# Patient Record
Sex: Male | Born: 1950 | Race: White | Hispanic: No | Marital: Married | Smoking: Never smoker
Health system: Southern US, Community
[De-identification: ages and names within clinical notes are randomized; demographics above are authoritative.]

## PROBLEM LIST (undated history)

## (undated) DIAGNOSIS — I10 Essential (primary) hypertension: Secondary | ICD-10-CM

## (undated) DIAGNOSIS — G4730 Sleep apnea, unspecified: Secondary | ICD-10-CM

## (undated) DIAGNOSIS — Z955 Presence of coronary angioplasty implant and graft: Secondary | ICD-10-CM

## (undated) DIAGNOSIS — M5416 Radiculopathy, lumbar region: Secondary | ICD-10-CM

## (undated) DIAGNOSIS — E119 Type 2 diabetes mellitus without complications: Secondary | ICD-10-CM

## (undated) DIAGNOSIS — I639 Cerebral infarction, unspecified: Secondary | ICD-10-CM

## (undated) DIAGNOSIS — J449 Chronic obstructive pulmonary disease, unspecified: Secondary | ICD-10-CM

## (undated) DIAGNOSIS — K219 Gastro-esophageal reflux disease without esophagitis: Secondary | ICD-10-CM

## (undated) HISTORY — PX: HX HIP REPLACEMENT: SHX124

## (undated) HISTORY — PX: HX BACK SURGERY: SHX140

## (undated) HISTORY — PX: CORONARY ARTERY ANGIOPLASTY: PR CATH30428

## (undated) HISTORY — DX: Radiculopathy, lumbar region: M54.16

## (undated) HISTORY — PX: SPINAL FUSION: SHX223

## (undated) HISTORY — PX: CORONARY ANGIOPLASTY WITH STENT PLACEMENT: SHX49

## (undated) HISTORY — PX: HERNIA REPAIR: SHX51

## (undated) HISTORY — DX: Type 2 diabetes mellitus without complications: E11.9

---

## 2002-04-03 ENCOUNTER — Ambulatory Visit (HOSPITAL_COMMUNITY): Payer: Self-pay | Admitting: INTERNAL MEDICINE

## 2002-04-19 ENCOUNTER — Ambulatory Visit (INDEPENDENT_AMBULATORY_CARE_PROVIDER_SITE_OTHER): Payer: Self-pay

## 2003-09-01 DIAGNOSIS — Z955 Presence of coronary angioplasty implant and graft: Secondary | ICD-10-CM

## 2003-09-01 HISTORY — DX: Presence of coronary angioplasty implant and graft: Z95.5

## 2015-01-25 DIAGNOSIS — N400 Enlarged prostate without lower urinary tract symptoms: Secondary | ICD-10-CM | POA: Diagnosis not present

## 2015-01-25 DIAGNOSIS — M549 Dorsalgia, unspecified: Secondary | ICD-10-CM | POA: Diagnosis not present

## 2015-01-25 DIAGNOSIS — J029 Acute pharyngitis, unspecified: Secondary | ICD-10-CM | POA: Diagnosis not present

## 2015-01-25 DIAGNOSIS — N19 Unspecified kidney failure: Secondary | ICD-10-CM | POA: Diagnosis not present

## 2015-03-26 DIAGNOSIS — E869 Volume depletion, unspecified: Secondary | ICD-10-CM | POA: Diagnosis not present

## 2015-03-26 DIAGNOSIS — R110 Nausea: Secondary | ICD-10-CM | POA: Diagnosis not present

## 2015-03-26 DIAGNOSIS — N179 Acute kidney failure, unspecified: Secondary | ICD-10-CM | POA: Diagnosis not present

## 2015-03-26 DIAGNOSIS — E785 Hyperlipidemia, unspecified: Secondary | ICD-10-CM | POA: Diagnosis not present

## 2015-03-26 DIAGNOSIS — R079 Chest pain, unspecified: Secondary | ICD-10-CM | POA: Diagnosis not present

## 2015-03-26 DIAGNOSIS — I959 Hypotension, unspecified: Secondary | ICD-10-CM | POA: Diagnosis not present

## 2015-03-26 DIAGNOSIS — G4733 Obstructive sleep apnea (adult) (pediatric): Secondary | ICD-10-CM | POA: Diagnosis not present

## 2015-03-26 DIAGNOSIS — R42 Dizziness and giddiness: Secondary | ICD-10-CM | POA: Diagnosis not present

## 2015-03-26 DIAGNOSIS — I10 Essential (primary) hypertension: Secondary | ICD-10-CM | POA: Diagnosis not present

## 2015-03-27 DIAGNOSIS — N179 Acute kidney failure, unspecified: Secondary | ICD-10-CM | POA: Diagnosis not present

## 2015-03-27 DIAGNOSIS — I10 Essential (primary) hypertension: Secondary | ICD-10-CM | POA: Diagnosis present

## 2015-03-27 DIAGNOSIS — E119 Type 2 diabetes mellitus without complications: Secondary | ICD-10-CM | POA: Diagnosis present

## 2015-03-27 DIAGNOSIS — E860 Dehydration: Secondary | ICD-10-CM | POA: Diagnosis present

## 2015-03-27 DIAGNOSIS — Z8249 Family history of ischemic heart disease and other diseases of the circulatory system: Secondary | ICD-10-CM | POA: Diagnosis not present

## 2015-03-27 DIAGNOSIS — I959 Hypotension, unspecified: Secondary | ICD-10-CM | POA: Diagnosis present

## 2015-03-27 DIAGNOSIS — I2510 Atherosclerotic heart disease of native coronary artery without angina pectoris: Secondary | ICD-10-CM | POA: Diagnosis present

## 2015-03-27 DIAGNOSIS — F17290 Nicotine dependence, other tobacco product, uncomplicated: Secondary | ICD-10-CM | POA: Diagnosis present

## 2015-03-27 DIAGNOSIS — G4733 Obstructive sleep apnea (adult) (pediatric): Secondary | ICD-10-CM | POA: Diagnosis present

## 2015-03-27 DIAGNOSIS — M549 Dorsalgia, unspecified: Secondary | ICD-10-CM | POA: Diagnosis present

## 2015-03-27 DIAGNOSIS — Z882 Allergy status to sulfonamides status: Secondary | ICD-10-CM | POA: Diagnosis not present

## 2015-03-27 DIAGNOSIS — E785 Hyperlipidemia, unspecified: Secondary | ICD-10-CM | POA: Diagnosis present

## 2015-03-27 DIAGNOSIS — E869 Volume depletion, unspecified: Secondary | ICD-10-CM | POA: Diagnosis present

## 2015-04-01 DIAGNOSIS — N19 Unspecified kidney failure: Secondary | ICD-10-CM | POA: Diagnosis not present

## 2015-04-01 DIAGNOSIS — N400 Enlarged prostate without lower urinary tract symptoms: Secondary | ICD-10-CM | POA: Diagnosis not present

## 2015-04-01 DIAGNOSIS — M549 Dorsalgia, unspecified: Secondary | ICD-10-CM | POA: Diagnosis not present

## 2015-04-01 DIAGNOSIS — J029 Acute pharyngitis, unspecified: Secondary | ICD-10-CM | POA: Diagnosis not present

## 2015-04-08 DIAGNOSIS — J029 Acute pharyngitis, unspecified: Secondary | ICD-10-CM | POA: Diagnosis not present

## 2015-04-08 DIAGNOSIS — N19 Unspecified kidney failure: Secondary | ICD-10-CM | POA: Diagnosis not present

## 2015-04-08 DIAGNOSIS — N400 Enlarged prostate without lower urinary tract symptoms: Secondary | ICD-10-CM | POA: Diagnosis not present

## 2015-04-08 DIAGNOSIS — M549 Dorsalgia, unspecified: Secondary | ICD-10-CM | POA: Diagnosis not present

## 2015-04-22 ENCOUNTER — Other Ambulatory Visit (HOSPITAL_COMMUNITY): Payer: Self-pay | Admitting: Internal Medicine

## 2015-04-22 ENCOUNTER — Encounter (HOSPITAL_BASED_OUTPATIENT_CLINIC_OR_DEPARTMENT_OTHER): Payer: Self-pay | Admitting: Emergency Medicine

## 2015-04-22 ENCOUNTER — Emergency Department (HOSPITAL_BASED_OUTPATIENT_CLINIC_OR_DEPARTMENT_OTHER)
Admission: EM | Admit: 2015-04-22 | Discharge: 2015-04-22 | Disposition: A | Discharge status: Home / Self Care | Payer: No Typology Code available for payment source | Attending: Emergency Medicine | Admitting: Emergency Medicine

## 2015-04-22 DIAGNOSIS — N3945 Continuous leakage: Secondary | ICD-10-CM

## 2015-04-22 DIAGNOSIS — E119 Type 2 diabetes mellitus without complications: Secondary | ICD-10-CM | POA: Diagnosis not present

## 2015-04-22 DIAGNOSIS — Z8669 Personal history of other diseases of the nervous system and sense organs: Secondary | ICD-10-CM | POA: Diagnosis not present

## 2015-04-22 DIAGNOSIS — R51 Headache: Secondary | ICD-10-CM | POA: Insufficient documentation

## 2015-04-22 DIAGNOSIS — N410 Acute prostatitis: Secondary | ICD-10-CM

## 2015-04-22 DIAGNOSIS — Z7982 Long term (current) use of aspirin: Secondary | ICD-10-CM | POA: Diagnosis not present

## 2015-04-22 DIAGNOSIS — R42 Dizziness and giddiness: Secondary | ICD-10-CM | POA: Insufficient documentation

## 2015-04-22 DIAGNOSIS — Z79899 Other long term (current) drug therapy: Secondary | ICD-10-CM | POA: Insufficient documentation

## 2015-04-22 DIAGNOSIS — R05 Cough: Secondary | ICD-10-CM | POA: Diagnosis not present

## 2015-04-22 DIAGNOSIS — R110 Nausea: Secondary | ICD-10-CM | POA: Diagnosis present

## 2015-04-22 HISTORY — DX: Presence of coronary angioplasty implant and graft: Z95.5

## 2015-04-22 HISTORY — DX: Sleep apnea, unspecified: G47.30

## 2015-04-22 LAB — CBC WITH DIFFERENTIAL/PLATELET
BASOS ABS: 0 10*3/uL (ref 0.0–0.1)
Basophils Relative: 0 % (ref 0–1)
Eosinophils Absolute: 0.1 10*3/uL (ref 0.0–0.7)
Eosinophils Relative: 1 % (ref 0–5)
HEMATOCRIT: 40.1 % (ref 39.0–52.0)
Hemoglobin: 13.5 g/dL (ref 13.0–17.0)
LYMPHS ABS: 1 10*3/uL (ref 0.7–4.0)
LYMPHS PCT: 11 % — AB (ref 12–46)
MCH: 30.3 pg (ref 26.0–34.0)
MCHC: 33.7 g/dL (ref 30.0–36.0)
MCV: 90.1 fL (ref 78.0–100.0)
MONO ABS: 0.9 10*3/uL (ref 0.1–1.0)
MONOS PCT: 11 % (ref 3–12)
NEUTROS ABS: 6.7 10*3/uL (ref 1.7–7.7)
Neutrophils Relative %: 77 % (ref 43–77)
Platelets: 124 10*3/uL — ABNORMAL LOW (ref 150–400)
RBC: 4.45 MIL/uL (ref 4.22–5.81)
RDW: 14 % (ref 11.5–15.5)
WBC: 8.7 10*3/uL (ref 4.0–10.5)

## 2015-04-22 LAB — COMPREHENSIVE METABOLIC PANEL
ALBUMIN: 3.6 g/dL (ref 3.5–5.0)
ALT: 20 U/L (ref 17–63)
ANION GAP: 9 (ref 5–15)
AST: 22 U/L (ref 15–41)
Alkaline Phosphatase: 62 U/L (ref 38–126)
BUN: 13 mg/dL (ref 6–20)
CHLORIDE: 100 mmol/L — AB (ref 101–111)
CO2: 26 mmol/L (ref 22–32)
Calcium: 9 mg/dL (ref 8.9–10.3)
Creatinine, Ser: 1.08 mg/dL (ref 0.61–1.24)
GFR calc Af Amer: >60 mL/min (ref >60)
GFR calc non Af Amer: >60 mL/min (ref >60)
GLUCOSE: 173 mg/dL — AB (ref 65–99)
POTASSIUM: 4.4 mmol/L (ref 3.5–5.1)
SODIUM: 135 mmol/L (ref 135–145)
TOTAL PROTEIN: 7.2 g/dL (ref 6.5–8.1)
Total Bilirubin: 1 mg/dL (ref 0.3–1.2)

## 2015-04-22 LAB — URINALYSIS, ROUTINE W REFLEX MICROSCOPIC
BILIRUBIN URINE: NEGATIVE
Glucose, UA: >1000 mg/dL — AB
Hgb urine dipstick: NEGATIVE
Ketones, ur: NEGATIVE mg/dL
NITRITE: NEGATIVE
PH: 7.5 (ref 5.0–8.0)
Protein, ur: 30 mg/dL — AB
SPECIFIC GRAVITY, URINE: 1.022 (ref 1.005–1.030)
Urobilinogen, UA: 1 mg/dL (ref 0.0–1.0)

## 2015-04-22 LAB — URINE MICROSCOPIC-ADD ON

## 2015-04-22 MED ORDER — SODIUM CHLORIDE 0.9 % IV BOLUS (SEPSIS)
1000.0000 mL | Freq: Once | INTRAVENOUS | Status: AC
  Administered 2015-04-22: 1000 mL via INTRAVENOUS

## 2015-04-22 MED ORDER — ONDANSETRON HCL 4 MG/2ML IJ SOLN
4.0000 mg | Freq: Once | INTRAMUSCULAR | Status: AC
  Administered 2015-04-22: 4 mg via INTRAVENOUS
  Filled 2015-04-22: qty 2

## 2015-04-22 MED ORDER — CIPROFLOXACIN HCL 500 MG PO TABS: 500.0000 mg | ORAL_TABLET | Freq: Two times a day (BID) | ORAL | Status: DC

## 2015-04-22 NOTE — ED Provider Notes (Signed)
CSN: [id number]     Arrival date & time 04/22/15  1943 History  This chart was scribed for Joseph Belling, MD by Helane Gunther, ED Scribe. This patient was seen in room MH07/MH07 and the patient's care was started at 8:00 PM.    Chief Complaint  Patient presents with  . Nausea   The history is provided by the patient. No language interpreter was used.   HPI Comments: Joseph Zuniga is a 64 y.o. male who presents to the Emergency Department complaining of nausea onset 3 days ago. He reports associated fever, a "pounding" frontal HA, dizziness, orange-tinged urine, bladder incontinence (onset today, large amount as soon as he stands up), lower back pain, cough, as well as a burning sensation to his face. He notes he never gets headaches. He states he was working in the heat about 1.5 months ago and felt dizzy. He took his BP which was 60/50. He went to the hospital after calling his PCP. He was admitted and diagnosed with kidney failure. After some IV fluids he was doing much better. A week after this, his vitals were back to normal, until the symptoms started back up. He is on Plavix. He notes his wife is having similar symptoms, in addition to vomiting. Pt denies SOB, sore throat, bowel issues and vomiting. He is allergic to sulfa antibiotics.  Past Medical History  Diagnosis Date  . Stented coronary artery   . Diabetes mellitus without complication   . Sleep apnea    History reviewed. No pertinent past surgical history. History reviewed. No pertinent family history. Social History  Substance Use Topics  . Smoking status: Never Smoker   . Smokeless tobacco: None  . Alcohol Use: Yes     Comment: occ    Review of Systems  HENT: Negative for sore throat.   Respiratory: Positive for cough. Negative for shortness of breath.   Gastrointestinal: Positive for nausea. Negative for vomiting.  Musculoskeletal: Positive for back pain.  Neurological: Positive for dizziness and headaches.  All  other systems reviewed and are negative.   Allergies  Sulfa antibiotics  Home Medications   Prior to Admission medications   Medication Sig Start Date End Date Taking? Authorizing Provider  aspirin 81 MG tablet Take 81 mg by mouth daily.   Yes Historical Provider, MD  atenolol (TENORMIN) 50 MG tablet Take 50 mg by mouth daily.   Yes Historical Provider, MD  gabapentin (NEURONTIN) 600 MG tablet Take 600 mg by mouth 3 (three) times daily.   Yes Historical Provider, MD  glimepiride (AMARYL) 4 MG tablet Take 4 mg by mouth daily with breakfast.   Yes Historical Provider, MD  Multiple Vitamin (MULTIVITAMIN WITH MINERALS) TABS tablet Take 1 tablet by mouth daily.   Yes Historical Provider, MD  omeprazole (PRILOSEC) 20 MG capsule Take 20 mg by mouth daily.   Yes Historical Provider, MD  oxyCODONE-acetaminophen (PERCOCET) 10-325 MG per tablet Take 1 tablet by mouth every 4 (four) hours as needed for pain.   Yes Historical Provider, MD  pregabalin (LYRICA) 75 MG capsule Take 75 mg by mouth 2 (two) times daily.   Yes Historical Provider, MD  simvastatin (ZOCOR) 20 MG tablet Take 20 mg by mouth daily.   Yes Historical Provider, MD  ciprofloxacin (CIPRO) 500 MG tablet Take 1 tablet (500 mg total) by mouth 2 (two) times daily. 04/22/15   Joseph Belling, MD   BP 123/53 mmHg  Pulse 80  Temp(Src) 100.1 F (37.8 C) (Oral)  Resp  18  Ht 5\' 8"  (1.727 m)  Wt 203 lb (92.08 kg)  BMI 30.87 kg/m2  SpO2 98% Physical Exam  Constitutional: He appears well-developed and well-nourished.  HENT:  Head: Normocephalic and atraumatic.  Face is mildly flushed  Eyes: Conjunctivae are normal. Right eye exhibits no discharge. Left eye exhibits no discharge.  Pulmonary/Chest: Effort normal and breath sounds normal. No respiratory distress.  Lungs clear to auscultation  Abdominal: Soft. Bowel sounds are normal. He exhibits no mass. There is no tenderness.  No abdominal mass or ttp  Musculoskeletal: He exhibits no  edema.  No CVA TTP or Lumbar TTP. No peripheral edema. Peroneal sensation is intact.  Neurological: He is alert. Coordination normal.  Skin: Skin is warm and dry. No rash noted. He is not diaphoretic. No erythema.  Psychiatric: He has a normal mood and affect.  Nursing note and vitals reviewed.   ED Course  Procedures  DIAGNOSTIC STUDIES: Oxygen Saturation is 100% on RA, normal by my interpretation.    COORDINATION OF CARE: 8:11 PM - Discussed possible UTI or other infection. Discussed plans to order diagnostic studies. Pt advised of plan for treatment and pt agrees.  Labs Review Labs Reviewed  COMPREHENSIVE METABOLIC PANEL - Abnormal; Notable for the following:    Chloride 100 (*)    Glucose, Bld 173 (*)    All other components within normal limits  CBC WITH DIFFERENTIAL/PLATELET - Abnormal; Notable for the following:    Platelets 124 (*)    Lymphocytes Relative 11 (*)    All other components within normal limits  URINALYSIS, ROUTINE W REFLEX MICROSCOPIC (NOT AT [facility]) - Abnormal; Notable for the following:    Glucose, UA >1000 (*)    Protein, ur 30 (*)    Leukocytes, UA TRACE (*)    All other components within normal limits  URINE MICROSCOPIC-ADD ON    Imaging Review No results found. I have personally reviewed and evaluated these images and lab results as part of my medical decision-making.   EKG Interpretation None      MDM   Final diagnoses:  Acute prostatitis   patient with headache. Has had some nausea. Also urinary symptoms such as incontinence. No dysuria. No urinary retention. 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 Good rectal tone. Tenderness over prostate and will treat his prostatitis. Doubt epidural abscess or severe intracranial injury/infection. Will discharge home and have follow-up with urology.   I personally performed the services described in this documentation, which  was scribed in my presence. The recorded information has been reviewed and is accurate.    Joseph Belling, MD 04/22/15 [phone]

## 2015-04-22 NOTE — ED Notes (Signed)
Pt states has had trouble controlling x 2-3 days  Worse today denies pain w urination

## 2015-04-22 NOTE — ED Notes (Signed)
Patient reports that over the last few days he has felt hot and flush with a fever. The patient reports that he is also having some nausea and incontinence.

## 2015-04-22 NOTE — Discharge Instructions (Signed)

## 2015-04-29 ENCOUNTER — Ambulatory Visit (HOSPITAL_COMMUNITY): Payer: Self-pay

## 2015-06-28 DIAGNOSIS — Z79891 Long term (current) use of opiate analgesic: Secondary | ICD-10-CM | POA: Diagnosis not present

## 2015-06-28 DIAGNOSIS — M5136 Other intervertebral disc degeneration, lumbar region: Secondary | ICD-10-CM | POA: Diagnosis not present

## 2015-06-28 DIAGNOSIS — M545 Low back pain: Secondary | ICD-10-CM | POA: Diagnosis not present

## 2015-09-26 ENCOUNTER — Emergency Department (HOSPITAL_BASED_OUTPATIENT_CLINIC_OR_DEPARTMENT_OTHER): Payer: Medicare Other

## 2015-09-26 ENCOUNTER — Emergency Department (HOSPITAL_BASED_OUTPATIENT_CLINIC_OR_DEPARTMENT_OTHER)
Admission: EM | Admit: 2015-09-26 | Discharge: 2015-09-26 | Disposition: A | Discharge status: Home / Self Care | Payer: Medicare Other | Attending: Emergency Medicine | Admitting: Emergency Medicine

## 2015-09-26 ENCOUNTER — Encounter (HOSPITAL_BASED_OUTPATIENT_CLINIC_OR_DEPARTMENT_OTHER): Payer: Self-pay | Admitting: *Deleted

## 2015-09-26 DIAGNOSIS — Z7984 Long term (current) use of oral hypoglycemic drugs: Secondary | ICD-10-CM | POA: Insufficient documentation

## 2015-09-26 DIAGNOSIS — R42 Dizziness and giddiness: Secondary | ICD-10-CM

## 2015-09-26 DIAGNOSIS — E785 Hyperlipidemia, unspecified: Secondary | ICD-10-CM | POA: Insufficient documentation

## 2015-09-26 DIAGNOSIS — Z7902 Long term (current) use of antithrombotics/antiplatelets: Secondary | ICD-10-CM | POA: Diagnosis not present

## 2015-09-26 DIAGNOSIS — E119 Type 2 diabetes mellitus without complications: Secondary | ICD-10-CM | POA: Insufficient documentation

## 2015-09-26 DIAGNOSIS — Z8669 Personal history of other diseases of the nervous system and sense organs: Secondary | ICD-10-CM | POA: Insufficient documentation

## 2015-09-26 DIAGNOSIS — Z7982 Long term (current) use of aspirin: Secondary | ICD-10-CM | POA: Diagnosis not present

## 2015-09-26 DIAGNOSIS — Z9861 Coronary angioplasty status: Secondary | ICD-10-CM | POA: Insufficient documentation

## 2015-09-26 DIAGNOSIS — R1011 Right upper quadrant pain: Secondary | ICD-10-CM | POA: Diagnosis not present

## 2015-09-26 DIAGNOSIS — Z79899 Other long term (current) drug therapy: Secondary | ICD-10-CM | POA: Diagnosis not present

## 2015-09-26 DIAGNOSIS — R61 Generalized hyperhidrosis: Secondary | ICD-10-CM

## 2015-09-26 DIAGNOSIS — I10 Essential (primary) hypertension: Secondary | ICD-10-CM | POA: Insufficient documentation

## 2015-09-26 DIAGNOSIS — R109 Unspecified abdominal pain: Secondary | ICD-10-CM | POA: Diagnosis not present

## 2015-09-26 DIAGNOSIS — R110 Nausea: Secondary | ICD-10-CM | POA: Insufficient documentation

## 2015-09-26 HISTORY — DX: Essential (primary) hypertension: I10

## 2015-09-26 LAB — HEPATIC FUNCTION PANEL
ALK PHOS: 57 U/L (ref 38–126)
ALT: 49 U/L (ref 17–63)
AST: 34 U/L (ref 15–41)
Albumin: 4.3 g/dL (ref 3.5–5.0)
BILIRUBIN DIRECT: 0.1 mg/dL (ref 0.1–0.5)
BILIRUBIN INDIRECT: 0.7 mg/dL (ref 0.3–0.9)
BILIRUBIN TOTAL: 0.8 mg/dL (ref 0.3–1.2)
TOTAL PROTEIN: 7.7 g/dL (ref 6.5–8.1)

## 2015-09-26 LAB — CBC WITH DIFFERENTIAL/PLATELET
BASOS ABS: 0 10*3/uL (ref 0.0–0.1)
Basophils Relative: 1 %
EOS PCT: 3 %
Eosinophils Absolute: 0.2 10*3/uL (ref 0.0–0.7)
HEMATOCRIT: 46.2 % (ref 39.0–52.0)
Hemoglobin: 15.8 g/dL (ref 13.0–17.0)
LYMPHS ABS: 1.6 10*3/uL (ref 0.7–4.0)
LYMPHS PCT: 31 %
MCH: 29.8 pg (ref 26.0–34.0)
MCHC: 34.2 g/dL (ref 30.0–36.0)
MCV: 87 fL (ref 78.0–100.0)
MONO ABS: 0.6 10*3/uL (ref 0.1–1.0)
Monocytes Relative: 11 %
NEUTROS ABS: 2.7 10*3/uL (ref 1.7–7.7)
Neutrophils Relative %: 54 %
PLATELETS: 148 10*3/uL — AB (ref 150–400)
RBC: 5.31 MIL/uL (ref 4.22–5.81)
RDW: 13.7 % (ref 11.5–15.5)
WBC: 5 10*3/uL (ref 4.0–10.5)

## 2015-09-26 LAB — BASIC METABOLIC PANEL
Anion gap: 11 (ref 5–15)
BUN: 12 mg/dL (ref 6–20)
CHLORIDE: 108 mmol/L (ref 101–111)
CO2: 23 mmol/L (ref 22–32)
CREATININE: 1.12 mg/dL (ref 0.61–1.24)
Calcium: 9.2 mg/dL (ref 8.9–10.3)
Glucose, Bld: 188 mg/dL — ABNORMAL HIGH (ref 65–99)
Potassium: 3.9 mmol/L (ref 3.5–5.1)
SODIUM: 142 mmol/L (ref 135–145)

## 2015-09-26 LAB — TROPONIN I: Troponin I: <0.03 ng/mL (ref <0.031)

## 2015-09-26 LAB — CBG MONITORING, ED: GLUCOSE-CAPILLARY: 165 mg/dL — AB (ref 65–99)

## 2015-09-26 LAB — LIPASE, BLOOD: LIPASE: 20 U/L (ref 11–51)

## 2015-09-26 NOTE — ED Notes (Signed)
Pt calls out that he is having sharp pain in the right lower quadrant of his abdomen.  Pt is lying in bed, texting on his cellphone in no apparent distress.

## 2015-09-26 NOTE — ED Notes (Signed)
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 ekg in progress while pt being triaged.

## 2015-09-26 NOTE — Discharge Instructions (Signed)
Dizziness °Dizziness is a common problem. It is a feeling of unsteadiness or light-headedness. You may feel like you are about to faint. Dizziness can lead to injury if you stumble or fall. Anyone can become dizzy, but dizziness is more common in older adults. This condition can be caused by a number of things, including medicines, dehydration, or illness. °HOME CARE INSTRUCTIONS °Taking these steps may help with your condition: °Eating and Drinking °· Drink enough fluid to keep your urine clear or pale yellow. This helps to keep you from becoming dehydrated. Try to drink more clear fluids, such as water. °· Do not drink alcohol. °· Limit your caffeine intake if directed by your health care provider. °· Limit your salt intake if directed by your health care provider. °Activity °· Avoid making quick movements. °· Rise slowly from chairs and steady yourself until you feel okay. °· In the morning, first sit up on the side of the bed. When you feel okay, stand slowly while you hold onto something until you know that your balance is fine. °· Move your legs often if you need to stand in one place for a long time. Tighten and relax your muscles in your legs while you are standing. °· Do not drive or operate heavy machinery if you feel dizzy. °· Avoid bending down if you feel dizzy. Place items in your home so that they are easy for you to reach without leaning over. °Lifestyle °· Do not use any tobacco products, including cigarettes, chewing tobacco, or electronic cigarettes. If you need help quitting, ask your health care provider. °· Try to reduce your stress level, such as with yoga or meditation. Talk with your health care provider if you need help. °General Instructions °· Watch your dizziness for any changes. °· Take medicines only as directed by your health care provider. Talk with your health care provider if you think that your dizziness is caused by a medicine that you are taking. °· Tell a friend or a family  member that you are feeling dizzy. If he or she notices any changes in your behavior, have this person call your health care provider. °· Keep all follow-up visits as directed by your health care provider. This is important. °SEEK MEDICAL CARE IF: °· Your dizziness does not go away. °· Your dizziness or light-headedness gets worse. °· You feel nauseous. °· You have reduced hearing. °· You have new symptoms. °· You are unsteady on your feet or you feel like the room is spinning. °SEEK IMMEDIATE MEDICAL CARE IF: °· You vomit or have diarrhea and are unable to eat or drink anything. °· You have problems talking, walking, swallowing, or using your arms, hands, or legs. °· You feel generally weak. °· You are not thinking clearly or you have trouble forming sentences. It may take a friend or family member to notice this. °· You have chest pain, abdominal pain, shortness of breath, or sweating. °· Your vision changes. °· You notice any bleeding. °· You have a headache. °· You have neck pain or a stiff neck. °· You have a fever. °  °This information is not intended to replace advice given to you by your health care provider. Make sure you discuss any questions you have with your health care provider. °  °Document Released: 02/10/2001 Document Revised: 01/01/2015 Document Reviewed: 08/13/2014 °Elsevier Interactive Patient Education ©2016 [employer] ° °Nausea and Vomiting °Nausea is a sick feeling that often comes before throwing up (vomiting). Vomiting is a   reflex where stomach contents come out of your mouth. Vomiting can cause severe loss of body fluids (dehydration). Children and elderly adults can become dehydrated quickly, especially if they also have diarrhea. Nausea and vomiting are symptoms of a condition or disease. It is important to find the cause of your symptoms. °CAUSES  °· Direct irritation of the stomach lining. This irritation can result from increased acid production (gastroesophageal reflux disease),  infection, food poisoning, taking certain medicines (such as nonsteroidal anti-inflammatory drugs), alcohol use, or tobacco use. °· Signals from the brain. These signals could be caused by a headache, heat exposure, an inner ear disturbance, increased pressure in the brain from injury, infection, a tumor, or a concussion, pain, emotional stimulus, or metabolic problems. °· An obstruction in the gastrointestinal tract (bowel obstruction). °· Illnesses such as diabetes, hepatitis, gallbladder problems, appendicitis, kidney problems, cancer, sepsis, atypical symptoms of a heart attack, or eating disorders. °· Medical treatments such as chemotherapy and radiation. °· Receiving medicine that makes you sleep (general anesthetic) during surgery. °DIAGNOSIS °Your caregiver may ask for tests to be done if the problems do not improve after a few days. Tests may also be done if symptoms are severe or if the reason for the nausea and vomiting is not clear. Tests may include: °· Urine tests. °· Blood tests. °· Stool tests. °· Cultures (to look for evidence of infection). °· X-rays or other imaging studies. °Test results can help your caregiver make decisions about treatment or the need for additional tests. °TREATMENT °You need to stay well hydrated. Drink frequently but in small amounts. You may wish to drink water, sports drinks, clear broth, or eat frozen ice pops or gelatin dessert to help stay hydrated. When you eat, eating slowly may help prevent nausea. There are also some antinausea medicines that may help prevent nausea. °HOME CARE INSTRUCTIONS  °· Take all medicine as directed by your caregiver. °· If you do not have an appetite, do not force yourself to eat. However, you must continue to drink fluids. °· If you have an appetite, eat a normal diet unless your caregiver tells you differently. °¨ Eat a variety of complex carbohydrates (rice, wheat, potatoes, bread), lean meats, yogurt, fruits, and vegetables. °¨ Avoid  high-fat foods because they are more difficult to digest. °· Drink enough water and fluids to keep your urine clear or pale yellow. °· If you are dehydrated, ask your caregiver for specific rehydration instructions. Signs of dehydration may include: °¨ Severe thirst. °¨ Dry lips and mouth. °¨ Dizziness. °¨ Dark urine. °¨ Decreasing urine frequency and amount. °¨ Confusion. °¨ Rapid breathing or pulse. °SEEK IMMEDIATE MEDICAL CARE IF:  °· You have blood or brown flecks (like coffee grounds) in your vomit. °· You have black or bloody stools. °· You have a severe headache or stiff neck. °· You are confused. °· You have severe abdominal pain. °· You have chest pain or trouble breathing. °· You do not urinate at least once every 8 hours. °· You develop cold or clammy skin. °· You continue to vomit for longer than 24 to 48 hours. °· You have a fever. °MAKE SURE YOU:  °· Understand these instructions. °· Will watch your condition. °· Will get help right away if you are not doing well or get worse. °  °This information is not intended to replace advice given to you by your health care provider. Make sure you discuss any questions you have with your health care provider. °  °Document Released: 08/17/2005 Document   Revised: 11/09/2011 Document Reviewed: 01/14/2011 °Elsevier Interactive Patient Education ©2016 [employer] ° °

## 2015-09-26 NOTE — ED Provider Notes (Signed)
CSN: [id number]     Arrival date & time 09/26/15  0907 History   First MD Initiated Contact with Patient 09/26/15 0930     Chief Complaint  Patient presents with  . Dizziness     Patient is a 65 y.o. male presenting with dizziness. The history is provided by the patient. No language interpreter was used.  Dizziness  Joseph Zuniga is a 65 y.o. male who presents to the Emergency Department complaining of dizziness.  About 8:30 AM this morning he developed some dizziness/lightheadedness, nausea, diaphoresis. This occurred while he was at work and eating breakfast. He reports intermittent generalized abdominal pain for the last week. He denies any fevers, chest pain, shortness of breath, vomiting. He has a history of hypertension, diabetes, hyperlipidemia, coronary artery disease status post stenting in 2005 and 2007. Symptoms are moderate, waxing and waning, improving.  Past Medical History  Diagnosis Date  . Stented coronary artery   . Diabetes mellitus without complication ([place])   . Sleep apnea   . Hypertension    History reviewed. No pertinent past surgical history. History reviewed. No pertinent family history. Social History  Substance Use Topics  . Smoking status: Never Smoker   . Smokeless tobacco: None  . Alcohol Use: Yes     Comment: occ    Review of Systems  Neurological: Positive for dizziness.  All other systems reviewed and are negative.     Allergies  Sulfa antibiotics  Home Medications   Prior to Admission medications   Medication Sig Start Date End Date Taking? Authorizing Provider  atenolol (TENORMIN) 50 MG tablet Take 50 mg by mouth daily.   Yes Historical Provider, MD  clopidogrel (PLAVIX) 75 MG tablet Take 75 mg by mouth daily.   Yes Historical Provider, MD  docusate sodium (COLACE) 100 MG capsule Take 100 mg by mouth 2 (two) times daily.   Yes Historical Provider, MD  aspirin 81 MG tablet Take 81 mg by mouth daily.    Historical Provider, MD   gabapentin (NEURONTIN) 600 MG tablet Take 600 mg by mouth 3 (three) times daily.    Historical Provider, MD  glimepiride (AMARYL) 4 MG tablet Take 4 mg by mouth daily with breakfast.    Historical Provider, MD  omeprazole (PRILOSEC) 20 MG capsule Take 20 mg by mouth daily.    Historical Provider, MD  oxyCODONE-acetaminophen (PERCOCET) 10-325 MG per tablet Take 1 tablet by mouth every 4 (four) hours as needed for pain.    Historical Provider, MD  simvastatin (ZOCOR) 20 MG tablet Take 20 mg by mouth daily.    Historical Provider, MD   BP 133/82 mmHg  Pulse 63  Temp(Src) 97.6 F (36.4 C) (Oral)  Resp 18  SpO2 97% Physical Exam  Constitutional: He is oriented to person, place, and time. He appears well-developed and well-nourished.  HENT:  Head: Normocephalic and atraumatic.  Cardiovascular: Normal rate and regular rhythm.   No murmur heard. Pulmonary/Chest: Effort normal and breath sounds normal. No respiratory distress.  Abdominal: Soft. There is no rebound and no guarding.  Moderate right upper quadrant tenderness  Musculoskeletal: He exhibits no edema or tenderness.  Neurological: He is alert and oriented to person, place, and time.  Skin: Skin is warm and dry.  Psychiatric: He has a normal mood and affect. His behavior is normal.  Nursing note and vitals reviewed.   ED Course  Procedures (including critical care time) Labs Review Labs Reviewed  CBC WITH DIFFERENTIAL/PLATELET - Abnormal; Notable for the  following:    Platelets 148 (*)    All other components within normal limits  BASIC METABOLIC PANEL - Abnormal; Notable for the following:    Glucose, Bld 188 (*)    All other components within normal limits  CBG MONITORING, ED - Abnormal; Notable for the following:    Glucose-Capillary 165 (*)    All other components within normal limits  TROPONIN I  HEPATIC FUNCTION PANEL  LIPASE, BLOOD  TROPONIN I    Imaging Review Dg Chest 2 View  09/26/2015  CLINICAL DATA:   Sweating and dizziness EXAM: CHEST  2 VIEW COMPARISON:  None. FINDINGS: Lungs are clear. Heart size and pulmonary vascularity are normal. No adenopathy. There is degenerative change in the lower thoracic spine. IMPRESSION: No edema or consolidation. Electronically Signed   By: Lowella Grip III M.D.   On: 09/26/2015 10:31   US Abdomen Complete  09/26/2015  CLINICAL DATA:  Abdominal pain with nausea for 1 day EXAM: ABDOMEN ULTRASOUND COMPLETE COMPARISON:  None. FINDINGS: Gallbladder: No gallstones or wall thickening visualized. There is no pericholecystic fluid. No sonographic Murphy sign noted by sonographer. Common bile duct: Diameter: 2 mm. There is no intrahepatic, common hepatic, or common bile duct dilatation. Liver: No focal lesion identified.  Liver echogenicity is increased. IVC: No abnormality visualized. Pancreas: Visualized portion unremarkable. Portions of pancreas are obscured by gas. Spleen: Size and appearance within normal limits. Right Kidney: Length: 10.1 cm. Echogenicity within normal limits. No mass or hydronephrosis visualized. Left Kidney: Length: 10.2 cm. Echogenicity within normal limits. No mass or hydronephrosis visualized. Abdominal aorta: No aneurysm visualized. Other findings: No demonstrable ascites. IMPRESSION: Portions of pancreas obscured by gas. Visualized portions of pancreas appear normal. Liver echogenicity is increased, a finding most likely due to hepatic steatosis. While no focal liver lesions are identified, it must be cautioned that the sensitivity of ultrasound for focal liver lesions is diminished in this circumstance. Study otherwise unremarkable. Electronically Signed   By: Lowella Grip III M.D.   On: 09/26/2015 10:35   I have personally reviewed and evaluated these images and lab results as part of my medical decision-making.   EKG Interpretation None      ED ECG REPORT   Date: 09/26/2015  Rate: 64  Rhythm: normal sinus rhythm  QRS Axis:  normal  Intervals: normal  ST/T Wave abnormalities: normal  Conduction Disutrbances:none  Narrative Interpretation:   Old EKG Reviewed: none available  I have personally reviewed the EKG tracing and agree with the computerized printout as noted.   MDM   Final diagnoses:  Dizziness  Diaphoresis   Patient with history of hypertension, hyperlipidemia, diabetes here for evaluation of nausea, diaphoresis, dizziness. Symptoms are improved on examination in the emergency department - no recurrent nausea/diaphoresis. He does have some mild to moderate right upper quadrant tenderness, abdominal ultrasound is negative for cholecystitis or AAA. He does not have a surgical abdomen on examination. Presentation is not consistent with ACS, PE, dissection. Discussed with patient unclear source of his symptoms and recommendation for close outpatient follow-up as well as return precautions.     Quintella Reichert, MD 09/26/15 [phone]

## 2015-09-27 DIAGNOSIS — M5136 Other intervertebral disc degeneration, lumbar region: Secondary | ICD-10-CM | POA: Diagnosis not present

## 2015-09-27 DIAGNOSIS — M5126 Other intervertebral disc displacement, lumbar region: Secondary | ICD-10-CM | POA: Diagnosis not present

## 2015-09-27 DIAGNOSIS — Z79891 Long term (current) use of opiate analgesic: Secondary | ICD-10-CM | POA: Diagnosis not present

## 2015-10-08 ENCOUNTER — Telehealth: Payer: Self-pay | Admitting: Behavioral Health

## 2015-10-08 NOTE — Telephone Encounter (Signed)
Attempted to reach patient for Pre-Visit Call. Unable to leave a message at this time.

## 2015-10-09 ENCOUNTER — Encounter: Payer: Self-pay | Admitting: Physician Assistant

## 2015-10-09 ENCOUNTER — Ambulatory Visit (INDEPENDENT_AMBULATORY_CARE_PROVIDER_SITE_OTHER): Payer: Medicare Other | Admitting: Physician Assistant

## 2015-10-09 VITALS — BP 114/69 | HR 64 | Temp 98.0°F | Ht 68.0 in | Wt 216.2 lb

## 2015-10-09 DIAGNOSIS — E1142 Type 2 diabetes mellitus with diabetic polyneuropathy: Secondary | ICD-10-CM | POA: Diagnosis not present

## 2015-10-09 DIAGNOSIS — I2510 Atherosclerotic heart disease of native coronary artery without angina pectoris: Secondary | ICD-10-CM

## 2015-10-09 DIAGNOSIS — R5382 Chronic fatigue, unspecified: Secondary | ICD-10-CM

## 2015-10-09 DIAGNOSIS — G4733 Obstructive sleep apnea (adult) (pediatric): Secondary | ICD-10-CM

## 2015-10-09 DIAGNOSIS — Z1211 Encounter for screening for malignant neoplasm of colon: Secondary | ICD-10-CM | POA: Diagnosis not present

## 2015-10-09 LAB — COMPREHENSIVE METABOLIC PANEL
ALBUMIN: 4.1 g/dL (ref 3.5–5.2)
ALT: 28 U/L (ref 0–53)
AST: 19 U/L (ref 0–37)
Alkaline Phosphatase: 50 U/L (ref 39–117)
BILIRUBIN TOTAL: 0.7 mg/dL (ref 0.2–1.2)
BUN: 18 mg/dL (ref 6–23)
CALCIUM: 9.2 mg/dL (ref 8.4–10.5)
CHLORIDE: 106 meq/L (ref 96–112)
CO2: 25 meq/L (ref 19–32)
CREATININE: 1.19 mg/dL (ref 0.40–1.50)
GFR: 65.02 mL/min (ref >60.00)
Glucose, Bld: 151 mg/dL — ABNORMAL HIGH (ref 70–99)
Potassium: 3.9 mEq/L (ref 3.5–5.1)
Sodium: 139 mEq/L (ref 135–145)
Total Protein: 6.9 g/dL (ref 6.0–8.3)

## 2015-10-09 LAB — CBC
HEMATOCRIT: 44.2 % (ref 39.0–52.0)
HEMOGLOBIN: 14.8 g/dL (ref 13.0–17.0)
MCHC: 33.5 g/dL (ref 30.0–36.0)
MCV: 88.8 fl (ref 78.0–100.0)
PLATELETS: 148 10*3/uL — AB (ref 150.0–400.0)
RBC: 4.97 Mil/uL (ref 4.22–5.81)
RDW: 14.3 % (ref 11.5–15.5)
WBC: 5 10*3/uL (ref 4.0–10.5)

## 2015-10-09 LAB — LIPID PANEL
CHOL/HDL RATIO: 4
CHOLESTEROL: 165 mg/dL (ref 0–200)
HDL: 39.4 mg/dL (ref >39.00)
NONHDL: 125.23
TRIGLYCERIDES: 225 mg/dL — AB (ref 0.0–149.0)
VLDL: 45 mg/dL — ABNORMAL HIGH (ref 0.0–40.0)

## 2015-10-09 LAB — HEMOGLOBIN A1C: Hgb A1c MFr Bld: 7.3 % — ABNORMAL HIGH (ref 4.6–6.5)

## 2015-10-09 LAB — TSH: TSH: 3.25 u[IU]/mL (ref 0.35–4.50)

## 2015-10-09 LAB — LDL CHOLESTEROL, DIRECT: LDL DIRECT: 88 mg/dL

## 2015-10-09 LAB — VITAMIN D 25 HYDROXY (VIT D DEFICIENCY, FRACTURES): VITD: 32.8 ng/mL (ref 30.00–100.00)

## 2015-10-09 NOTE — Progress Notes (Signed)
 Patient presents to clinic today to establish care.  Chronic Issues: Peripheral Neuropathy -- secondary to diabetes and s/p complications from spinal fusion surgery. Is on Gabapentin 600 mg AM and 300 mg PM with only slight relief. Is taking Percocet as directed to help when severe pain is present.   Diabetes Mellitus II -- with neuropathy. Denies retinopathy or chronic kidney disease. Is currently on Amaryl 4 mg. Endorses checking fasting sugars with levels averaging 100-110. Is unsure of last Eye examination or foot examination.  Sleep Apnea -- patient has been off of CPAP for the past couple of months. Restarted last week due to fatigue and daytime somnolence. Is noting some improvement since restarting CPAP but still noted fatigue. Denies depressed mood or anhedonia. Denies history of anemia or vitamin deficiency.   Coronary Artery Disease -- s/p EST and cardiac cath with stent placement. Patient is unsure of location and type of stent placed. Is having records sent over. Denies history of MI or angina. Is currently on 81 mg ASA daily and Plavix 75 mg. Is on Atenolol for BP control. Patient denies chest pain, palpitations, lightheadedness, dizziness, vision changes or frequent headaches.  BP Readings from Last 3 Encounters:  10/09/15 114/69  09/26/15 133/82  04/22/15 123/53   Health Maintenance: Immunizations -- flu shot today Colonoscopy -- Due for repeat Colonoscopy. Last in 2012 with polyps.   Past Medical History  Diagnosis Date  . Stented coronary artery 2005  . Diabetes mellitus ([place])   . Sleep apnea     recently restarted CPAP due to fatigue  . Hypertension   . Lumbar radiculopathy     s/p Spinal Fusion     Past Surgical History  Procedure Laterality Date  . Spinal fusion    . Coronary angioplasty with stent placement    . Hernia repair      Current Outpatient Prescriptions on File Prior to Visit  Medication Sig Dispense Refill  . aspirin 81 MG tablet Take 81 mg  by mouth daily.    Marland Kitchen atenolol (TENORMIN) 50 MG tablet Take 50 mg by mouth daily.    . clopidogrel (PLAVIX) 75 MG tablet Take 75 mg by mouth daily.    Marland Kitchen docusate sodium (COLACE) 100 MG capsule Take 100 mg by mouth 2 (two) times daily.    Marland Kitchen glimepiride (AMARYL) 4 MG tablet Take 4 mg by mouth daily with breakfast.    . omeprazole (PRILOSEC) 20 MG capsule Take 20 mg by mouth daily.    Marland Kitchen oxyCODONE-acetaminophen (PERCOCET) 10-325 MG per tablet Take 1 tablet by mouth every 4 (four) hours as needed for pain.    . simvastatin (ZOCOR) 20 MG tablet Take 20 mg by mouth daily.     No current facility-administered medications on file prior to visit.    Allergies  Allergen Reactions  . Sulfa Antibiotics Rash    Family History  Problem Relation Age of Onset  . Hypertension Mother   . CAD Father   . CAD Brother     MI 59s  . CAD Paternal Uncle     Died MI 19s  . CAD Paternal 22     Died MI 14s    Social History   Social History  . Marital Status: Married    Spouse Name: N/A  . Number of Children: N/A  . Years of Education: N/A   Occupational History  . Not on file.   Social History Main Topics  . Smoking status: Never Smoker   .  Smokeless tobacco: Never Used  . Alcohol Use: 0.0 oz/week    0 Standard drinks or equivalent per week     Comment: occ  . Drug Use: No  . Sexual Activity: Not on file   Other Topics Concern  . Not on file   Social History Narrative   Review of Systems  Constitutional: Positive for malaise/fatigue. Negative for fever and weight loss.  HENT: Negative for ear discharge, ear pain, hearing loss and tinnitus.   Eyes: Negative for blurred vision, double vision, photophobia and pain.  Respiratory: Negative for cough and shortness of breath.   Cardiovascular: Negative for chest pain and palpitations.  Gastrointestinal: Negative for heartburn, nausea, vomiting, abdominal pain, diarrhea, constipation, blood in stool and melena.  Genitourinary: Negative for  dysuria, urgency, frequency, hematuria and flank pain.  Musculoskeletal: Positive for joint pain. Negative for falls.  Neurological: Negative for dizziness, loss of consciousness and headaches.  Endo/Heme/Allergies: Negative for environmental allergies.  Psychiatric/Behavioral: Negative for depression, suicidal ideas, hallucinations and substance abuse. The patient is not nervous/anxious and does not have insomnia.    BP 114/69 mmHg  Pulse 64  Temp(Src) 98 F (36.7 C) (Oral)  Ht 5' 8"  (1.727 m)  Wt 216 lb 3.2 oz (98.068 kg)  BMI 32.88 kg/m2  SpO2 96%  Physical Exam  Constitutional: He is oriented to person, place, and time and well-developed, well-nourished, and in no distress.  HENT:  Head: Normocephalic and atraumatic.  Eyes: Conjunctivae are normal. Pupils are equal, round, and reactive to light.  Neck: Neck supple. No thyromegaly present.  Cardiovascular: Normal rate, regular rhythm, normal heart sounds and intact distal pulses.   Pulmonary/Chest: Effort normal and breath sounds normal. No respiratory distress. He has no wheezes. He has no rales. He exhibits no tenderness.  Abdominal: Soft. Bowel sounds are normal. He exhibits no distension and no mass. There is no tenderness. There is no rebound and no guarding.  Neurological: He is alert and oriented to person, place, and time.  Skin: Skin is warm and dry. No rash noted.  Psychiatric: Affect normal.  Vitals reviewed.  Diabetic Foot Form - Detailed   Diabetic Foot Exam - detailed  Diabetic Foot exam was performed with the following findings:  Yes 10/09/2015  8:00 AM  Visual Foot Exam completed.:  Yes  Is there a history of foot ulcer?:  No  Can the patient see the bottom of their feet?:  Yes  Are the shoes appropriate in style and fit?:  Yes  Is there swelling or and abnormal foot shape?:  No  Are the toenails long?:  No  Are the toenails thick?:  No  Do you have pain in calf while walking?:  No  Is there a claw toe  deformity?:  No  Is there elevated skin temparature?:  No  Is there limited skin dorsiflexion?:  No  Is there foot or ankle muscle weakness?:  No  Are the toenails ingrown?:  No  Normal Range of Motion:  Yes    Pulse Foot Exam completed.:  Yes  Right posterior Tibialias:  Present Left posterior Tibialias:  Present  Right Dorsalis Pedis:  Present Left Dorsalis Pedis:  Present  Sensory Foot Exam Completed.:  Yes  Swelling:  No  Semmes-Weinstein Monofilament Test  R Foot Test Control:  Neg L Foot Test Control:  Neg  R Site 1-Great Toe:  Neg L Site 1-Great Toe:  Neg  R Site 4:  Neg L Site 4:  Neg  R  Site 5:  Neg L Site 5:  Neg        Diabetic Foot Form - Detailed   Diabetic Foot Exam - detailed  Diabetic Foot exam was performed with the following findings:  Yes 10/09/2015  8:00 AM  Visual Foot Exam completed.:  Yes  Is there a history of foot ulcer?:  No  Can the patient see the bottom of their feet?:  Yes  Are the shoes appropriate in style and fit?:  Yes  Is there swelling or and abnormal foot shape?:  No  Are the toenails long?:  No  Are the toenails thick?:  No  Do you have pain in calf while walking?:  No  Is there a claw toe deformity?:  No  Is there elevated skin temparature?:  No  Is there limited skin dorsiflexion?:  No  Is there foot or ankle muscle weakness?:  No  Are the toenails ingrown?:  No  Normal Range of Motion:  Yes    Pulse Foot Exam completed.:  Yes  Right posterior Tibialias:  Present Left posterior Tibialias:  Present  Right Dorsalis Pedis:  Present Left Dorsalis Pedis:  Present  Sensory Foot Exam Completed.:  Yes  Swelling:  No  Semmes-Weinstein Monofilament Test  R Foot Test Control:  Neg L Foot Test Control:  Neg  R Site 1-Great Toe:  Neg L Site 1-Great Toe:  Neg  R Site 4:  Neg L Site 4:  Neg  R Site 5:  Neg L Site 5:  Neg        Recent Results (from the past 2160 hour(s))  CBG monitoring, ED     Status: Abnormal   Collection Time: 09/26/15   9:22 AM  Result Value Ref Range   Glucose-Capillary 165 (H) 65 - 99 mg/dL   Comment 1 Notify RN    Comment 2 Document in Chart   CBC with Differential     Status: Abnormal   Collection Time: 09/26/15  9:28 AM  Result Value Ref Range   WBC 5.0 4.0 - 10.5 K/uL   RBC 5.31 4.22 - 5.81 MIL/uL   Hemoglobin 15.8 13.0 - 17.0 g/dL   HCT 46.2 39.0 - 52.0 %   MCV 87.0 78.0 - 100.0 fL   MCH 29.8 26.0 - 34.0 pg   MCHC 34.2 30.0 - 36.0 g/dL   RDW 13.7 11.5 - 15.5 %   Platelets 148 (L) 150 - 400 K/uL   Neutrophils Relative % 54 %   Neutro Abs 2.7 1.7 - 7.7 K/uL   Lymphocytes Relative 31 %   Lymphs Abs 1.6 0.7 - 4.0 K/uL   Monocytes Relative 11 %   Monocytes Absolute 0.6 0.1 - 1.0 K/uL   Eosinophils Relative 3 %   Eosinophils Absolute 0.2 0.0 - 0.7 K/uL   Basophils Relative 1 %   Basophils Absolute 0.0 0.0 - 0.1 K/uL  Basic metabolic panel     Status: Abnormal   Collection Time: 09/26/15  9:28 AM  Result Value Ref Range   Sodium 142 135 - 145 mmol/L   Potassium 3.9 3.5 - 5.1 mmol/L   Chloride 108 101 - 111 mmol/L   CO2 23 22 - 32 mmol/L   Glucose, Bld 188 (H) 65 - 99 mg/dL   BUN 12 6 - 20 mg/dL   Creatinine, Ser 1.12 0.61 - 1.24 mg/dL   Calcium 9.2 8.9 - 10.3 mg/dL   GFR calc non Af Amer >60 >60 mL/min   GFR  calc Af Amer >60 >60 mL/min    Comment: (NOTE) The eGFR has been calculated using the CKD EPI equation. This calculation has not been validated in all clinical situations. eGFR's persistently <60 mL/min signify possible Chronic Kidney Disease.    Anion gap 11 5 - 15  Troponin I     Status: None   Collection Time: 09/26/15  9:28 AM  Result Value Ref Range   Troponin I <0.03 <0.031 ng/mL    Comment:        NO INDICATION OF MYOCARDIAL INJURY.   Hepatic function panel     Status: None   Collection Time: 09/26/15  9:28 AM  Result Value Ref Range   Total Protein 7.7 6.5 - 8.1 g/dL   Albumin 4.3 3.5 - 5.0 g/dL   AST 34 15 - 41 U/L   ALT 49 17 - 63 U/L   Alkaline Phosphatase  57 38 - 126 U/L   Total Bilirubin 0.8 0.3 - 1.2 mg/dL   Bilirubin, Direct 0.1 0.1 - 0.5 mg/dL   Indirect Bilirubin 0.7 0.3 - 0.9 mg/dL  Lipase, blood     Status: None   Collection Time: 09/26/15  9:28 AM  Result Value Ref Range   Lipase 20 11 - 51 U/L  Troponin I     Status: None   Collection Time: 09/26/15 12:25 PM  Result Value Ref Range   Troponin I <0.03 <0.031 ng/mL    Comment:        NO INDICATION OF MYOCARDIAL INJURY.   CBC     Status: Abnormal   Collection Time: 10/09/15  9:00 AM  Result Value Ref Range   WBC 5.0 4.0 - 10.5 K/uL   RBC 4.97 4.22 - 5.81 Mil/uL   Platelets 148.0 (L) 150.0 - 400.0 K/uL   Hemoglobin 14.8 13.0 - 17.0 g/dL   HCT 44.2 39.0 - 52.0 %   MCV 88.8 78.0 - 100.0 fl   MCHC 33.5 30.0 - 36.0 g/dL   RDW 14.3 11.5 - 15.5 %  Comp Met (CMET)     Status: Abnormal   Collection Time: 10/09/15  9:00 AM  Result Value Ref Range   Sodium 139 135 - 145 mEq/L   Potassium 3.9 3.5 - 5.1 mEq/L   Chloride 106 96 - 112 mEq/L   CO2 25 19 - 32 mEq/L   Glucose, Bld 151 (H) 70 - 99 mg/dL   BUN 18 6 - 23 mg/dL   Creatinine, Ser 1.19 0.40 - 1.50 mg/dL   Total Bilirubin 0.7 0.2 - 1.2 mg/dL   Alkaline Phosphatase 50 39 - 117 U/L   AST 19 0 - 37 U/L   ALT 28 0 - 53 U/L   Total Protein 6.9 6.0 - 8.3 g/dL   Albumin 4.1 3.5 - 5.2 g/dL   Calcium 9.2 8.4 - 10.5 mg/dL   GFR 65.02 >60.00 mL/min  Lipid Profile     Status: Abnormal   Collection Time: 10/09/15  9:00 AM  Result Value Ref Range   Cholesterol 165 0 - 200 mg/dL    Comment: ATP III Classification       Desirable:  < 200 mg/dL               Borderline High:  200 - 239 mg/dL          High:  > = 240 mg/dL   Triglycerides 225.0 (H) 0.0 - 149.0 mg/dL    Comment: Normal:  <150 mg/dLBorderline High:  150 -  199 mg/dL   HDL 39.40 >39.00 mg/dL   VLDL 45.0 (H) 0.0 - 40.0 mg/dL   Total CHOL/HDL Ratio 4     Comment:                Men          Women1/2 Average Risk     3.4          3.3Average Risk          5.0          4.42X  Average Risk          9.6          7.13X Average Risk          15.0          11.0                       NonHDL 125.23     Comment: NOTE:  Non-HDL goal should be 30 mg/dL higher than patient's LDL goal (i.e. LDL goal of < 70 mg/dL, would have non-HDL goal of < 100 mg/dL)  TSH     Status: None   Collection Time: 10/09/15  9:00 AM  Result Value Ref Range   TSH 3.25 0.35 - 4.50 uIU/mL  Hemoglobin A1c     Status: Abnormal   Collection Time: 10/09/15  9:00 AM  Result Value Ref Range   Hgb A1c MFr Bld 7.3 (H) 4.6 - 6.5 %    Comment: Glycemic Control Guidelines for People with Diabetes:Non Diabetic:  <6%Goal of Therapy: <7%Additional Action Suggested:  >8%   Vitamin D (25 hydroxy)     Status: None   Collection Time: 10/09/15  9:00 AM  Result Value Ref Range   VITD 32.80 30.00 - 100.00 ng/mL  LDL cholesterol, direct     Status: None   Collection Time: 10/09/15  9:00 AM  Result Value Ref Range   Direct LDL 88.0 mg/dL    Comment: Optimal:  <100 mg/dLNear or Above Optimal:  100-129 mg/dLBorderline High:  130-159 mg/dLHigh:  160-189 mg/dLVery High:  >190 mg/dL    Assessment/Plan: OSA (obstructive sleep apnea) Has CPAP but is not wearing which I suspect is contributing to this chronic fatigue. Patient to restart CPAP nightly.   Controlled type 2 diabetes mellitus with diabetic polyneuropathy, without long-term current use of insulin ([facility]) Will check A1C and BMP today. Foot exam performed showing mild sensory loss. Will continue current medication regimen with new dosing instructions for Gabapentin to help with neurpathic pain. Patient to schedule appointment for eye examination.  Chronic fatigue Patient with multiple chronic issues including DM II with neuropathy, CAD and Sleep Apnea. Patient is not using CPAP so suspect fatigue is related to OSA. Will check lab panel today to assess further. Patient to resume nightly CPAP.  Colon cancer screening Referral to GI placed for screening  colonoscopy.  Coronary artery disease involving native coronary artery of native heart without angina pectoris Followed by Cardiology. Continue anti-platelet therapy and statin. Will check lipid panel and LFTs.

## 2015-10-09 NOTE — Progress Notes (Signed)
Patient presents to clinic today to establish care.  Chronic Issues: Peripheral Neuropathy -- secondary to diabetes and s/p complications from spinal fusion surgery. Is on Gabapentin 600 mg AM and 300 mg PM with only slight relief. Is taking Percocet as directed to help when severe pain is present.   Diabetes Mellitus II -- with neuropathy. Denies retinopathy or chronic kidney disease. Is currently on Amaryl 4 mg. Endorses checking fasting sugars with levels averaging 100-110. Is unsure of last Eye examination or foot examination.  Sleep Apnea -- patient has been off of CPAP for the past couple of months. Restarted last week due to fatigue and daytime somnolence. Is noting some improvement since restarting CPAP but still noted fatigue. Denies depressed mood or anhedonia. Denies history of anemia or vitamin deficiency.   Coronary Artery Disease -- s/p EST and cardiac cath with stent placement. Patient is unsure of location and type of stent placed. Is having records sent over. Denies history of MI or angina. Is currently on 81 mg ASA daily and Plavix 75 mg. Is on Atenolol for BP control. Patient denies chest pain, palpitations, lightheadedness, dizziness, vision changes or frequent headaches.  BP Readings from Last 3 Encounters:  10/09/15 114/69  09/26/15 133/82  04/22/15 123/53   Health Maintenance: Immunizations -- flu shot today Colonoscopy -- Due for repeat Colonoscopy. Last in 2012 with polyps.   Past Medical History  Diagnosis Date  . Stented coronary artery 2005  . Diabetes mellitus ([place])   . Sleep apnea     recently restarted CPAP due to fatigue  . Hypertension   . Lumbar radiculopathy     s/p Spinal Fusion     Past Surgical History  Procedure Laterality Date  . Spinal fusion    . Coronary angioplasty with stent placement    . Hernia repair      Current Outpatient Prescriptions on File Prior to Visit  Medication Sig Dispense Refill  . aspirin 81 MG tablet Take 81 mg  by mouth daily.    Marland Kitchen atenolol (TENORMIN) 50 MG tablet Take 50 mg by mouth daily.    . clopidogrel (PLAVIX) 75 MG tablet Take 75 mg by mouth daily.    Marland Kitchen docusate sodium (COLACE) 100 MG capsule Take 100 mg by mouth 2 (two) times daily.    Marland Kitchen glimepiride (AMARYL) 4 MG tablet Take 4 mg by mouth daily with breakfast.    . omeprazole (PRILOSEC) 20 MG capsule Take 20 mg by mouth daily.    Marland Kitchen oxyCODONE-acetaminophen (PERCOCET) 10-325 MG per tablet Take 1 tablet by mouth every 4 (four) hours as needed for pain.    . simvastatin (ZOCOR) 20 MG tablet Take 20 mg by mouth daily.     No current facility-administered medications on file prior to visit.    Allergies  Allergen Reactions  . Sulfa Antibiotics Rash    Family History  Problem Relation Age of Onset  . Hypertension Mother   . CAD Father   . CAD Brother     MI 59s  . CAD Paternal Uncle     Died MI 19s  . CAD Paternal 22     Died MI 14s    Social History   Social History  . Marital Status: Married    Spouse Name: N/A  . Number of Children: N/A  . Years of Education: N/A   Occupational History  . Not on file.   Social History Main Topics  . Smoking status: Never Smoker   .  Smokeless tobacco: Never Used  . Alcohol Use: 0.0 oz/week    0 Standard drinks or equivalent per week     Comment: occ  . Drug Use: No  . Sexual Activity: Not on file   Other Topics Concern  . Not on file   Social History Narrative   Review of Systems  Constitutional: Positive for malaise/fatigue. Negative for fever and weight loss.  HENT: Negative for ear discharge, ear pain, hearing loss and tinnitus.   Eyes: Negative for blurred vision, double vision, photophobia and pain.  Respiratory: Negative for cough and shortness of breath.   Cardiovascular: Negative for chest pain and palpitations.  Gastrointestinal: Negative for heartburn, nausea, vomiting, abdominal pain, diarrhea, constipation, blood in stool and melena.  Genitourinary: Negative for  dysuria, urgency, frequency, hematuria and flank pain.  Musculoskeletal: Positive for joint pain. Negative for falls.  Neurological: Negative for dizziness, loss of consciousness and headaches.  Endo/Heme/Allergies: Negative for environmental allergies.  Psychiatric/Behavioral: Negative for depression, suicidal ideas, hallucinations and substance abuse. The patient is not nervous/anxious and does not have insomnia.    BP 114/69 mmHg  Pulse 64  Temp(Src) 98 F (36.7 C) (Oral)  Ht 5' 8"  (1.727 m)  Wt 216 lb 3.2 oz (98.068 kg)  BMI 32.88 kg/m2  SpO2 96%  Physical Exam  Constitutional: He is oriented to person, place, and time and well-developed, well-nourished, and in no distress.  HENT:  Head: Normocephalic and atraumatic.  Eyes: Conjunctivae are normal. Pupils are equal, round, and reactive to light.  Neck: Neck supple. No thyromegaly present.  Cardiovascular: Normal rate, regular rhythm, normal heart sounds and intact distal pulses.   Pulmonary/Chest: Effort normal and breath sounds normal. No respiratory distress. He has no wheezes. He has no rales. He exhibits no tenderness.  Abdominal: Soft. Bowel sounds are normal. He exhibits no distension and no mass. There is no tenderness. There is no rebound and no guarding.  Neurological: He is alert and oriented to person, place, and time.  Skin: Skin is warm and dry. No rash noted.  Psychiatric: Affect normal.  Vitals reviewed.  Diabetic Foot Form - Detailed   Diabetic Foot Exam - detailed  Diabetic Foot exam was performed with the following findings:  Yes 10/09/2015  8:00 AM  Visual Foot Exam completed.:  Yes  Is there a history of foot ulcer?:  No  Can the patient see the bottom of their feet?:  Yes  Are the shoes appropriate in style and fit?:  Yes  Is there swelling or and abnormal foot shape?:  No  Are the toenails long?:  No  Are the toenails thick?:  No  Do you have pain in calf while walking?:  No  Is there a claw toe  deformity?:  No  Is there elevated skin temparature?:  No  Is there limited skin dorsiflexion?:  No  Is there foot or ankle muscle weakness?:  No  Are the toenails ingrown?:  No  Normal Range of Motion:  Yes    Pulse Foot Exam completed.:  Yes  Right posterior Tibialias:  Present Left posterior Tibialias:  Present  Right Dorsalis Pedis:  Present Left Dorsalis Pedis:  Present  Sensory Foot Exam Completed.:  Yes  Swelling:  No  Semmes-Weinstein Monofilament Test  R Foot Test Control:  Neg L Foot Test Control:  Neg  R Site 1-Great Toe:  Neg L Site 1-Great Toe:  Neg  R Site 4:  Neg L Site 4:  Neg  R  Site 5:  Neg L Site 5:  Neg        Diabetic Foot Form - Detailed   Diabetic Foot Exam - detailed  Diabetic Foot exam was performed with the following findings:  Yes 10/09/2015  8:00 AM  Visual Foot Exam completed.:  Yes  Is there a history of foot ulcer?:  No  Can the patient see the bottom of their feet?:  Yes  Are the shoes appropriate in style and fit?:  Yes  Is there swelling or and abnormal foot shape?:  No  Are the toenails long?:  No  Are the toenails thick?:  No  Do you have pain in calf while walking?:  No  Is there a claw toe deformity?:  No  Is there elevated skin temparature?:  No  Is there limited skin dorsiflexion?:  No  Is there foot or ankle muscle weakness?:  No  Are the toenails ingrown?:  No  Normal Range of Motion:  Yes    Pulse Foot Exam completed.:  Yes  Right posterior Tibialias:  Present Left posterior Tibialias:  Present  Right Dorsalis Pedis:  Present Left Dorsalis Pedis:  Present  Sensory Foot Exam Completed.:  Yes  Swelling:  No  Semmes-Weinstein Monofilament Test  R Foot Test Control:  Neg L Foot Test Control:  Neg  R Site 1-Great Toe:  Neg L Site 1-Great Toe:  Neg  R Site 4:  Neg L Site 4:  Neg  R Site 5:  Neg L Site 5:  Neg        Recent Results (from the past 2160 hour(s))  CBG monitoring, ED     Status: Abnormal   Collection Time: 09/26/15   9:22 AM  Result Value Ref Range   Glucose-Capillary 165 (H) 65 - 99 mg/dL   Comment 1 Notify RN    Comment 2 Document in Chart   CBC with Differential     Status: Abnormal   Collection Time: 09/26/15  9:28 AM  Result Value Ref Range   WBC 5.0 4.0 - 10.5 K/uL   RBC 5.31 4.22 - 5.81 MIL/uL   Hemoglobin 15.8 13.0 - 17.0 g/dL   HCT 46.2 39.0 - 52.0 %   MCV 87.0 78.0 - 100.0 fL   MCH 29.8 26.0 - 34.0 pg   MCHC 34.2 30.0 - 36.0 g/dL   RDW 13.7 11.5 - 15.5 %   Platelets 148 (L) 150 - 400 K/uL   Neutrophils Relative % 54 %   Neutro Abs 2.7 1.7 - 7.7 K/uL   Lymphocytes Relative 31 %   Lymphs Abs 1.6 0.7 - 4.0 K/uL   Monocytes Relative 11 %   Monocytes Absolute 0.6 0.1 - 1.0 K/uL   Eosinophils Relative 3 %   Eosinophils Absolute 0.2 0.0 - 0.7 K/uL   Basophils Relative 1 %   Basophils Absolute 0.0 0.0 - 0.1 K/uL  Basic metabolic panel     Status: Abnormal   Collection Time: 09/26/15  9:28 AM  Result Value Ref Range   Sodium 142 135 - 145 mmol/L   Potassium 3.9 3.5 - 5.1 mmol/L   Chloride 108 101 - 111 mmol/L   CO2 23 22 - 32 mmol/L   Glucose, Bld 188 (H) 65 - 99 mg/dL   BUN 12 6 - 20 mg/dL   Creatinine, Ser 1.12 0.61 - 1.24 mg/dL   Calcium 9.2 8.9 - 10.3 mg/dL   GFR calc non Af Amer >60 >60 mL/min   GFR  calc Af Amer >60 >60 mL/min    Comment: (NOTE) The eGFR has been calculated using the CKD EPI equation. This calculation has not been validated in all clinical situations. eGFR's persistently <60 mL/min signify possible Chronic Kidney Disease.    Anion gap 11 5 - 15  Troponin I     Status: None   Collection Time: 09/26/15  9:28 AM  Result Value Ref Range   Troponin I <0.03 <0.031 ng/mL    Comment:        NO INDICATION OF MYOCARDIAL INJURY.   Hepatic function panel     Status: None   Collection Time: 09/26/15  9:28 AM  Result Value Ref Range   Total Protein 7.7 6.5 - 8.1 g/dL   Albumin 4.3 3.5 - 5.0 g/dL   AST 34 15 - 41 U/L   ALT 49 17 - 63 U/L   Alkaline Phosphatase  57 38 - 126 U/L   Total Bilirubin 0.8 0.3 - 1.2 mg/dL   Bilirubin, Direct 0.1 0.1 - 0.5 mg/dL   Indirect Bilirubin 0.7 0.3 - 0.9 mg/dL  Lipase, blood     Status: None   Collection Time: 09/26/15  9:28 AM  Result Value Ref Range   Lipase 20 11 - 51 U/L  Troponin I     Status: None   Collection Time: 09/26/15 12:25 PM  Result Value Ref Range   Troponin I <0.03 <0.031 ng/mL    Comment:        NO INDICATION OF MYOCARDIAL INJURY.   CBC     Status: Abnormal   Collection Time: 10/09/15  9:00 AM  Result Value Ref Range   WBC 5.0 4.0 - 10.5 K/uL   RBC 4.97 4.22 - 5.81 Mil/uL   Platelets 148.0 (L) 150.0 - 400.0 K/uL   Hemoglobin 14.8 13.0 - 17.0 g/dL   HCT 44.2 39.0 - 52.0 %   MCV 88.8 78.0 - 100.0 fl   MCHC 33.5 30.0 - 36.0 g/dL   RDW 14.3 11.5 - 15.5 %  Comp Met (CMET)     Status: Abnormal   Collection Time: 10/09/15  9:00 AM  Result Value Ref Range   Sodium 139 135 - 145 mEq/L   Potassium 3.9 3.5 - 5.1 mEq/L   Chloride 106 96 - 112 mEq/L   CO2 25 19 - 32 mEq/L   Glucose, Bld 151 (H) 70 - 99 mg/dL   BUN 18 6 - 23 mg/dL   Creatinine, Ser 1.19 0.40 - 1.50 mg/dL   Total Bilirubin 0.7 0.2 - 1.2 mg/dL   Alkaline Phosphatase 50 39 - 117 U/L   AST 19 0 - 37 U/L   ALT 28 0 - 53 U/L   Total Protein 6.9 6.0 - 8.3 g/dL   Albumin 4.1 3.5 - 5.2 g/dL   Calcium 9.2 8.4 - 10.5 mg/dL   GFR 65.02 >60.00 mL/min  Lipid Profile     Status: Abnormal   Collection Time: 10/09/15  9:00 AM  Result Value Ref Range   Cholesterol 165 0 - 200 mg/dL    Comment: ATP III Classification       Desirable:  < 200 mg/dL               Borderline High:  200 - 239 mg/dL          High:  > = 240 mg/dL   Triglycerides 225.0 (H) 0.0 - 149.0 mg/dL    Comment: Normal:  <150 mg/dLBorderline High:  150 -  199 mg/dL   HDL 39.40 >39.00 mg/dL   VLDL 45.0 (H) 0.0 - 40.0 mg/dL   Total CHOL/HDL Ratio 4     Comment:                Men          Women1/2 Average Risk     3.4          3.3Average Risk          5.0          4.42X  Average Risk          9.6          7.13X Average Risk          15.0          11.0                       NonHDL 125.23     Comment: NOTE:  Non-HDL goal should be 30 mg/dL higher than patient's LDL goal (i.e. LDL goal of < 70 mg/dL, would have non-HDL goal of < 100 mg/dL)  TSH     Status: None   Collection Time: 10/09/15  9:00 AM  Result Value Ref Range   TSH 3.25 0.35 - 4.50 uIU/mL  Hemoglobin A1c     Status: Abnormal   Collection Time: 10/09/15  9:00 AM  Result Value Ref Range   Hgb A1c MFr Bld 7.3 (H) 4.6 - 6.5 %    Comment: Glycemic Control Guidelines for People with Diabetes:Non Diabetic:  <6%Goal of Therapy: <7%Additional Action Suggested:  >8%   Vitamin D (25 hydroxy)     Status: None   Collection Time: 10/09/15  9:00 AM  Result Value Ref Range   VITD 32.80 30.00 - 100.00 ng/mL  LDL cholesterol, direct     Status: None   Collection Time: 10/09/15  9:00 AM  Result Value Ref Range   Direct LDL 88.0 mg/dL    Comment: Optimal:  <100 mg/dLNear or Above Optimal:  100-129 mg/dLBorderline High:  130-159 mg/dLHigh:  160-189 mg/dLVery High:  >190 mg/dL    Assessment/Plan: OSA (obstructive sleep apnea) Has CPAP but is not wearing which I suspect is contributing to this chronic fatigue. Patient to restart CPAP nightly.   Controlled type 2 diabetes mellitus with diabetic polyneuropathy, without long-term current use of insulin (HCC) Will check A1C and BMP today. Foot exam performed showing mild sensory loss. Will continue current medication regimen with new dosing instructions for Gabapentin to help with neurpathic pain. Patient to schedule appointment for eye examination.  Chronic fatigue Patient with multiple chronic issues including DM II with neuropathy, CAD and Sleep Apnea. Patient is not using CPAP so suspect fatigue is related to OSA. Will check lab panel today to assess further. Patient to resume nightly CPAP.  Colon cancer screening Referral to GI placed for screening  colonoscopy.  Coronary artery disease involving native coronary artery of native heart without angina pectoris Followed by Cardiology. Continue anti-platelet therapy and statin. Will check lipid panel and LFTs.

## 2015-10-09 NOTE — Patient Instructions (Signed)
Please go to the lab for blood work. I will call you with your results. Please continue chronic medications as directed. Take Gabapentin (Neurontin) as follows: 600 mg AM, 300 mg noon, 600 mg PM   Follow-up with me in 1 month.  Please get some Sarna lotion to apply to the neck. Restart CPAP ASAP.  Diabetes and Exercise Exercising regularly is important. It is not just about losing weight. It has many health benefits, such as:  Improving your overall fitness, flexibility, and endurance.  Increasing your bone density.  Helping with weight control.  Decreasing your body fat.  Increasing your muscle strength.  Reducing stress and tension.  Improving your overall health. People with diabetes who exercise gain additional benefits because exercise:  Reduces appetite.  Improves the body's use of blood sugar (glucose).  Helps lower or control blood glucose.  Decreases blood pressure.  Helps control blood lipids (such as cholesterol and triglycerides).  Improves the body's use of the hormone insulin by:  Increasing the body's insulin sensitivity.  Reducing the body's insulin needs.  Decreases the risk for heart disease because exercising:  Lowers cholesterol and triglycerides levels.  Increases the levels of good cholesterol (such as high-density lipoproteins [HDL]) in the body.  Lowers blood glucose levels. YOUR ACTIVITY PLAN  Choose an activity that you enjoy, and set realistic goals. To exercise safely, you should begin practicing any new physical activity slowly, and gradually increase the intensity of the exercise over time. Your health care provider or diabetes educator can help create an activity plan that works for you. General recommendations include:  Encouraging children to engage in at least 60 minutes of physical activity each day.  Stretching and performing strength training exercises, such as yoga or weight lifting, at least 2 times per week.  Performing  a total of at least 150 minutes of moderate-intensity exercise each week, such as brisk walking or water aerobics.  Exercising at least 3 days per week, making sure you allow no more than 2 consecutive days to pass without exercising.  Avoiding long periods of inactivity (90 minutes or more). When you have to spend an extended period of time sitting down, take frequent breaks to walk or stretch. RECOMMENDATIONS FOR EXERCISING WITH TYPE 1 OR TYPE 2 DIABETES   Check your blood glucose before exercising. If blood glucose levels are greater than 240 mg/dL, check for urine ketones. Do not exercise if ketones are present.  Avoid injecting insulin into areas of the body that are going to be exercised. For example, avoid injecting insulin into:  The arms when playing tennis.  The legs when jogging.  Keep a record of:  Food intake before and after you exercise.  Expected peak times of insulin action.  Blood glucose levels before and after you exercise.  The type and amount of exercise you have done.  Review your records with your health care provider. Your health care provider will help you to develop guidelines for adjusting food intake and insulin amounts before and after exercising.  If you take insulin or oral hypoglycemic agents, watch for signs and symptoms of hypoglycemia. They include:  Dizziness.  Shaking.  Sweating.  Chills.  Confusion.  Drink plenty of water while you exercise to prevent dehydration or heat stroke. Body water is lost during exercise and must be replaced.  Talk to your health care provider before starting an exercise program to make sure it is safe for you. Remember, almost any type of activity is better than  none.   This information is not intended to replace advice given to you by your health care provider. Make sure you discuss any questions you have with your health care provider.   Document Released: 11/07/2003 Document Revised: 01/01/2015 Document  Reviewed: 01/24/2013 Elsevier Interactive Patient Education [employer].

## 2015-10-09 NOTE — Progress Notes (Signed)
Pre visit review using our clinic review tool, if applicable. No additional management support is needed unless otherwise documented below in the visit note. 

## 2015-10-14 DIAGNOSIS — R5382 Chronic fatigue, unspecified: Secondary | ICD-10-CM | POA: Insufficient documentation

## 2015-10-14 DIAGNOSIS — I2510 Atherosclerotic heart disease of native coronary artery without angina pectoris: Secondary | ICD-10-CM | POA: Insufficient documentation

## 2015-10-14 DIAGNOSIS — Z1211 Encounter for screening for malignant neoplasm of colon: Secondary | ICD-10-CM | POA: Insufficient documentation

## 2015-10-14 DIAGNOSIS — E1142 Type 2 diabetes mellitus with diabetic polyneuropathy: Secondary | ICD-10-CM | POA: Insufficient documentation

## 2015-10-14 DIAGNOSIS — G4733 Obstructive sleep apnea (adult) (pediatric): Secondary | ICD-10-CM | POA: Insufficient documentation

## 2015-10-14 NOTE — Assessment & Plan Note (Addendum)
Will check A1C and BMP today. Foot exam performed showing mild sensory loss. Will continue current medication regimen with new dosing instructions for Gabapentin to help with neurpathic pain. Patient to schedule appointment for eye examination.

## 2015-10-14 NOTE — Assessment & Plan Note (Signed)
Patient with multiple chronic issues including DM II with neuropathy, CAD and Sleep Apnea. Patient is not using CPAP so suspect fatigue is related to OSA. Will check lab panel today to assess further. Patient to resume nightly CPAP.

## 2015-10-14 NOTE — Assessment & Plan Note (Signed)
Referral to GI placed for screening colonoscopy.  

## 2015-10-14 NOTE — Assessment & Plan Note (Signed)
Has CPAP but is not wearing which I suspect is contributing to this chronic fatigue. Patient to restart CPAP nightly.

## 2015-10-14 NOTE — Assessment & Plan Note (Signed)
Followed by Cardiology. Continue anti-platelet therapy and statin. Will check lipid panel and LFTs.

## 2015-10-18 ENCOUNTER — Telehealth: Payer: Self-pay | Admitting: Physician Assistant

## 2015-10-18 NOTE — Telephone Encounter (Signed)
They are doing a consult for the colonoscopy. They review his concerns and history and make sure everything is set up appropriately for him. This is how it works for every patient. I cannot control their workflow. If we sent him to [facility] for a colonoscopy they will do the same thing.   It is just a consult for a procedure since they have never seen them.  It is up to him if he wants proceed.

## 2015-10-18 NOTE — Telephone Encounter (Signed)
Informed pt and he is to contact [facility] to schedule

## 2015-10-18 NOTE — Telephone Encounter (Signed)
Pt called to discuss colonoscopy. He said they need an office visit before he has the procedure/colonoscopy. He said what the hell can they possibly ask him? He has already had a visit with Einar Pheasant and shouldn't have to take additional time off work to sit down and talk to them about the same thing. He doesn't need to pay another copay and take time from work. What else is to know? He hasn't had one in 6 years and Einar Pheasant wants him to have one. Advised pt that we cannot control the process followed at the GI process prior to procedure. Pt is requesting call from [place] at Ph# [phone]

## 2015-11-06 ENCOUNTER — Ambulatory Visit (INDEPENDENT_AMBULATORY_CARE_PROVIDER_SITE_OTHER): Payer: Medicare Other | Admitting: Physician Assistant

## 2015-11-06 ENCOUNTER — Encounter: Payer: Self-pay | Admitting: Physician Assistant

## 2015-11-06 VITALS — BP 130/74 | HR 64 | Temp 97.7°F | Ht 68.0 in | Wt 212.0 lb

## 2015-11-06 DIAGNOSIS — E1142 Type 2 diabetes mellitus with diabetic polyneuropathy: Secondary | ICD-10-CM

## 2015-11-06 DIAGNOSIS — R5382 Chronic fatigue, unspecified: Secondary | ICD-10-CM | POA: Diagnosis not present

## 2015-11-06 DIAGNOSIS — I2510 Atherosclerotic heart disease of native coronary artery without angina pectoris: Secondary | ICD-10-CM | POA: Diagnosis not present

## 2015-11-06 DIAGNOSIS — G4733 Obstructive sleep apnea (adult) (pediatric): Secondary | ICD-10-CM

## 2015-11-06 DIAGNOSIS — Z23 Encounter for immunization: Secondary | ICD-10-CM

## 2015-11-06 LAB — MICROALBUMIN / CREATININE URINE RATIO
Creatinine,U: 358.5 mg/dL
MICROALB/CREAT RATIO: 3.7 mg/g (ref 0.0–30.0)
Microalb, Ur: 13.3 mg/dL — ABNORMAL HIGH (ref 0.0–1.9)

## 2015-11-06 LAB — TESTOSTERONE: Testosterone: 255.88 ng/dL — ABNORMAL LOW (ref 300.00–890.00)

## 2015-11-06 MED ORDER — GABAPENTIN 600 MG PO TABS: 600.0000 mg | ORAL_TABLET | Freq: Three times a day (TID) | ORAL | Status: AC

## 2015-11-06 NOTE — Progress Notes (Signed)
Pre visit review using our clinic review tool, if applicable. No additional management support is needed unless otherwise documented below in the visit note. 

## 2015-11-06 NOTE — Patient Instructions (Signed)
Please go to the lab for blood work. I will call with your results.  You will be contacted for an appointment with Dr. Elsworth Soho (Sleep Medicine/Pulmonology) for assessment and calibration of CPAP machine.  Continue chronic medications as directed. We will alter your regimen based on results.  Please call Gastroenterology at [phone]  Follow-up will be determined based on results.

## 2015-11-07 ENCOUNTER — Other Ambulatory Visit: Payer: Self-pay | Admitting: Physician Assistant

## 2015-11-07 MED ORDER — RAMIPRIL 2.5 MG PO CAPS: 2.5000 mg | ORAL_CAPSULE | Freq: Every day | ORAL | Status: AC

## 2015-11-08 DIAGNOSIS — E119 Type 2 diabetes mellitus without complications: Secondary | ICD-10-CM | POA: Diagnosis not present

## 2015-11-08 DIAGNOSIS — I10 Essential (primary) hypertension: Secondary | ICD-10-CM | POA: Diagnosis not present

## 2015-11-10 NOTE — Assessment & Plan Note (Signed)
Improving with CPAP. We will get this calibrated via Pulmonology. Will check testosterone level today.

## 2015-11-10 NOTE — Assessment & Plan Note (Signed)
Due to check urine microalbumin as patient is not on ACE or ARB

## 2015-11-10 NOTE — Progress Notes (Signed)
Patient presents to clinic today for follow-up of fatigue. Has restarted CPAP which is helping some with sleep and energy levels. Has not had CPAP calibrated in several years with last sleep study > 5 years ago. Would also like testosterone levels assessed.   Past Medical History  Diagnosis Date  . Stented coronary artery 2005  . Diabetes mellitus ([place])   . Sleep apnea     recently restarted CPAP due to fatigue  . Hypertension   . Lumbar radiculopathy     s/p Spinal Fusion     Current Outpatient Prescriptions on File Prior to Visit  Medication Sig Dispense Refill  . aspirin 81 MG tablet Take 81 mg by mouth daily.    Marland Kitchen atenolol (TENORMIN) 50 MG tablet Take 50 mg by mouth daily.    . clopidogrel (PLAVIX) 75 MG tablet Take 75 mg by mouth daily.    Marland Kitchen docusate sodium (COLACE) 100 MG capsule Take 100 mg by mouth 2 (two) times daily.    Marland Kitchen glimepiride (AMARYL) 4 MG tablet Take 4 mg by mouth daily with breakfast.    . omeprazole (PRILOSEC) 20 MG capsule Take 20 mg by mouth daily.    Marland Kitchen oxyCODONE-acetaminophen (PERCOCET) 10-325 MG per tablet Take 1 tablet by mouth every 4 (four) hours as needed for pain.    . simvastatin (ZOCOR) 20 MG tablet Take 20 mg by mouth daily.     No current facility-administered medications on file prior to visit.    Allergies  Allergen Reactions  . Sulfa Antibiotics Rash    Family History  Problem Relation Age of Onset  . Hypertension Mother   . CAD Father   . CAD Brother     MI 7s  . CAD Paternal Uncle     Died MI 64s  . CAD Paternal 66     Died MI 56s    Social History   Social History  . Marital Status: Married    Spouse Name: N/A  . Number of Children: N/A  . Years of Education: N/A   Social History Main Topics  . Smoking status: Never Smoker   . Smokeless tobacco: Never Used  . Alcohol Use: 0.0 oz/week    0 Standard drinks or equivalent per week     Comment: occ  . Drug Use: No  . Sexual Activity: Not Asked   Other Topics  Concern  . None   Social History Narrative    Review of Systems - See HPI.  All other ROS are negative.  BP 130/74 mmHg  Pulse 64  Temp(Src) 97.7 F (36.5 C) (Oral)  Ht 5' 8"  (1.727 m)  Wt 212 lb (96.163 kg)  BMI 32.24 kg/m2  SpO2 98%  Physical Exam  Constitutional: He is oriented to person, place, and time and well-developed, well-nourished, and in no distress.  HENT:  Head: Normocephalic and atraumatic.  Eyes: Conjunctivae are normal.  Neck: Neck supple.  Cardiovascular: Normal rate, regular rhythm, normal heart sounds and intact distal pulses.   Pulmonary/Chest: Effort normal and breath sounds normal. No respiratory distress. He has no wheezes. He has no rales. He exhibits no tenderness.  Neurological: He is alert and oriented to person, place, and time.  Skin: Skin is warm and dry. No rash noted.  Psychiatric: Affect normal.    Recent Results (from the past 2160 hour(s))  CBG monitoring, ED     Status: Abnormal   Collection Time: 09/26/15  9:22 AM  Result Value Ref Range  Glucose-Capillary 165 (H) 65 - 99 mg/dL   Comment 1 Notify RN    Comment 2 Document in Chart   CBC with Differential     Status: Abnormal   Collection Time: 09/26/15  9:28 AM  Result Value Ref Range   WBC 5.0 4.0 - 10.5 K/uL   RBC 5.31 4.22 - 5.81 MIL/uL   Hemoglobin 15.8 13.0 - 17.0 g/dL   HCT 46.2 39.0 - 52.0 %   MCV 87.0 78.0 - 100.0 fL   MCH 29.8 26.0 - 34.0 pg   MCHC 34.2 30.0 - 36.0 g/dL   RDW 13.7 11.5 - 15.5 %   Platelets 148 (L) 150 - 400 K/uL   Neutrophils Relative % 54 %   Neutro Abs 2.7 1.7 - 7.7 K/uL   Lymphocytes Relative 31 %   Lymphs Abs 1.6 0.7 - 4.0 K/uL   Monocytes Relative 11 %   Monocytes Absolute 0.6 0.1 - 1.0 K/uL   Eosinophils Relative 3 %   Eosinophils Absolute 0.2 0.0 - 0.7 K/uL   Basophils Relative 1 %   Basophils Absolute 0.0 0.0 - 0.1 K/uL  Basic metabolic panel     Status: Abnormal   Collection Time: 09/26/15  9:28 AM  Result Value Ref Range   Sodium  142 135 - 145 mmol/L   Potassium 3.9 3.5 - 5.1 mmol/L   Chloride 108 101 - 111 mmol/L   CO2 23 22 - 32 mmol/L   Glucose, Bld 188 (H) 65 - 99 mg/dL   BUN 12 6 - 20 mg/dL   Creatinine, Ser 1.12 0.61 - 1.24 mg/dL   Calcium 9.2 8.9 - 10.3 mg/dL   GFR calc non Af Amer >60 >60 mL/min   GFR calc Af Amer >60 >60 mL/min    Comment: (NOTE) The eGFR has been calculated using the CKD EPI equation. This calculation has not been validated in all clinical situations. eGFR's persistently <60 mL/min signify possible Chronic Kidney Disease.    Anion gap 11 5 - 15  Troponin I     Status: None   Collection Time: 09/26/15  9:28 AM  Result Value Ref Range   Troponin I <0.03 <0.031 ng/mL    Comment:        NO INDICATION OF MYOCARDIAL INJURY.   Hepatic function panel     Status: None   Collection Time: 09/26/15  9:28 AM  Result Value Ref Range   Total Protein 7.7 6.5 - 8.1 g/dL   Albumin 4.3 3.5 - 5.0 g/dL   AST 34 15 - 41 U/L   ALT 49 17 - 63 U/L   Alkaline Phosphatase 57 38 - 126 U/L   Total Bilirubin 0.8 0.3 - 1.2 mg/dL   Bilirubin, Direct 0.1 0.1 - 0.5 mg/dL   Indirect Bilirubin 0.7 0.3 - 0.9 mg/dL  Lipase, blood     Status: None   Collection Time: 09/26/15  9:28 AM  Result Value Ref Range   Lipase 20 11 - 51 U/L  Troponin I     Status: None   Collection Time: 09/26/15 12:25 PM  Result Value Ref Range   Troponin I <0.03 <0.031 ng/mL    Comment:        NO INDICATION OF MYOCARDIAL INJURY.   CBC     Status: Abnormal   Collection Time: 10/09/15  9:00 AM  Result Value Ref Range   WBC 5.0 4.0 - 10.5 K/uL   RBC 4.97 4.22 - 5.81 Mil/uL   Platelets  148.0 (L) 150.0 - 400.0 K/uL   Hemoglobin 14.8 13.0 - 17.0 g/dL   HCT 44.2 39.0 - 52.0 %   MCV 88.8 78.0 - 100.0 fl   MCHC 33.5 30.0 - 36.0 g/dL   RDW 14.3 11.5 - 15.5 %  Comp Met (CMET)     Status: Abnormal   Collection Time: 10/09/15  9:00 AM  Result Value Ref Range   Sodium 139 135 - 145 mEq/L   Potassium 3.9 3.5 - 5.1 mEq/L    Chloride 106 96 - 112 mEq/L   CO2 25 19 - 32 mEq/L   Glucose, Bld 151 (H) 70 - 99 mg/dL   BUN 18 6 - 23 mg/dL   Creatinine, Ser 1.19 0.40 - 1.50 mg/dL   Total Bilirubin 0.7 0.2 - 1.2 mg/dL   Alkaline Phosphatase 50 39 - 117 U/L   AST 19 0 - 37 U/L   ALT 28 0 - 53 U/L   Total Protein 6.9 6.0 - 8.3 g/dL   Albumin 4.1 3.5 - 5.2 g/dL   Calcium 9.2 8.4 - 10.5 mg/dL   GFR 65.02 >60.00 mL/min  Lipid Profile     Status: Abnormal   Collection Time: 10/09/15  9:00 AM  Result Value Ref Range   Cholesterol 165 0 - 200 mg/dL    Comment: ATP III Classification       Desirable:  < 200 mg/dL               Borderline High:  200 - 239 mg/dL          High:  > = 240 mg/dL   Triglycerides 225.0 (H) 0.0 - 149.0 mg/dL    Comment: Normal:  <150 mg/dLBorderline High:  150 - 199 mg/dL   HDL 39.40 >39.00 mg/dL   VLDL 45.0 (H) 0.0 - 40.0 mg/dL   Total CHOL/HDL Ratio 4     Comment:                Men          Women1/2 Average Risk     3.4          3.3Average Risk          5.0          4.42X Average Risk          9.6          7.13X Average Risk          15.0          11.0                       NonHDL 125.23     Comment: NOTE:  Non-HDL goal should be 30 mg/dL higher than patient's LDL goal (i.e. LDL goal of < 70 mg/dL, would have non-HDL goal of < 100 mg/dL)  TSH     Status: None   Collection Time: 10/09/15  9:00 AM  Result Value Ref Range   TSH 3.25 0.35 - 4.50 uIU/mL  Hemoglobin A1c     Status: Abnormal   Collection Time: 10/09/15  9:00 AM  Result Value Ref Range   Hgb A1c MFr Bld 7.3 (H) 4.6 - 6.5 %    Comment: Glycemic Control Guidelines for People with Diabetes:Non Diabetic:  <6%Goal of Therapy: <7%Additional Action Suggested:  >8%   Vitamin D (25 hydroxy)     Status: None   Collection Time: 10/09/15  9:00 AM  Result Value Ref  Range   VITD 32.80 30.00 - 100.00 ng/mL  LDL cholesterol, direct     Status: None   Collection Time: 10/09/15  9:00 AM  Result Value Ref Range   Direct LDL 88.0 mg/dL     Comment: Optimal:  <100 mg/dLNear or Above Optimal:  100-129 mg/dLBorderline High:  130-159 mg/dLHigh:  160-189 mg/dLVery High:  >190 mg/dL  Testosterone     Status: Abnormal   Collection Time: 11/06/15  7:46 AM  Result Value Ref Range   Testosterone 255.88 (L) 300.00 - 890.00 ng/dL  Urine Microalbumin w/creat. ratio     Status: Abnormal   Collection Time: 11/06/15  7:46 AM  Result Value Ref Range   Microalb, Ur 13.3 (H) 0.0 - 1.9 mg/dL   Creatinine,U 358.5 mg/dL   Microalb Creat Ratio 3.7 0.0 - 30.0 mg/g    Assessment/Plan: OSA (obstructive sleep apnea) Referral to Pulmonary placed for repeat assessment and calibration of CPAP machine  Controlled type 2 diabetes mellitus with diabetic polyneuropathy, without long-term current use of insulin (HCC) Due to check urine microalbumin as patient is not on ACE or ARB  Chronic fatigue Improving with CPAP. We will get this calibrated via Pulmonology. Will check testosterone level today.

## 2015-11-10 NOTE — Assessment & Plan Note (Signed)
Referral to Pulmonary placed for repeat assessment and calibration of CPAP machine

## 2016-02-05 ENCOUNTER — Ambulatory Visit (INDEPENDENT_AMBULATORY_CARE_PROVIDER_SITE_OTHER): Payer: Medicare Other | Admitting: Physician Assistant

## 2016-02-05 ENCOUNTER — Encounter: Payer: Self-pay | Admitting: Physician Assistant

## 2016-02-05 VITALS — BP 118/70 | HR 70 | Temp 98.2°F | Resp 16 | Ht 68.0 in | Wt 205.4 lb

## 2016-02-05 DIAGNOSIS — B9689 Other specified bacterial agents as the cause of diseases classified elsewhere: Secondary | ICD-10-CM

## 2016-02-05 DIAGNOSIS — J019 Acute sinusitis, unspecified: Secondary | ICD-10-CM

## 2016-02-05 DIAGNOSIS — I2510 Atherosclerotic heart disease of native coronary artery without angina pectoris: Secondary | ICD-10-CM | POA: Diagnosis not present

## 2016-02-05 MED ORDER — BENZONATATE 100 MG PO CAPS: 100.0000 mg | ORAL_CAPSULE | Freq: Three times a day (TID) | ORAL | Status: AC | PRN

## 2016-02-05 MED ORDER — DOXYCYCLINE HYCLATE 100 MG PO CAPS: 100.0000 mg | ORAL_CAPSULE | Freq: Two times a day (BID) | ORAL | Status: AC

## 2016-02-05 NOTE — Progress Notes (Signed)
Patient presents to clinic today c/o 1 week of sinus pressure, nasal drip and sore throat. Symptoms remained over the past week without change until yesterday. As of yesterday, patient notes sinus pain, ear pressure/pain, worsening sore throat with low-grade fever. Endorses some chest congestion with productive cough. Denies recent travel or sick contact. Has taken OTC medications with little relief in symptoms.  Past Medical History  Diagnosis Date  . Stented coronary artery 2005  . Diabetes mellitus ([place])   . Sleep apnea     recently restarted CPAP due to fatigue  . Hypertension   . Lumbar radiculopathy     s/p Spinal Fusion     Current Outpatient Prescriptions on File Prior to Visit  Medication Sig Dispense Refill  . aspirin 81 MG tablet Take 81 mg by mouth daily.    Marland Kitchen atenolol (TENORMIN) 50 MG tablet Take 50 mg by mouth daily.    . clopidogrel (PLAVIX) 75 MG tablet Take 75 mg by mouth daily.    Marland Kitchen docusate sodium (COLACE) 100 MG capsule Take 100 mg by mouth 2 (two) times daily.    Marland Kitchen gabapentin (NEURONTIN) 600 MG tablet Take 1 tablet (600 mg total) by mouth 3 (three) times daily. 90 tablet 5  . glimepiride (AMARYL) 4 MG tablet Take 4 mg by mouth daily with breakfast.    . omeprazole (PRILOSEC) 20 MG capsule Take 20 mg by mouth daily.    Marland Kitchen oxyCODONE-acetaminophen (PERCOCET) 10-325 MG per tablet Take 1 tablet by mouth every 4 (four) hours as needed for pain.    . ramipril (ALTACE) 2.5 MG capsule Take 1 capsule (2.5 mg total) by mouth daily. 30 capsule 1  . simvastatin (ZOCOR) 20 MG tablet Take 20 mg by mouth daily.     No current facility-administered medications on file prior to visit.    Allergies  Allergen Reactions  . Sulfa Antibiotics Rash    Family History  Problem Relation Age of Onset  . Hypertension Mother   . CAD Father   . CAD Brother     MI 48s  . CAD Paternal Uncle     Died MI 29s  . CAD Paternal 50     Died MI 31s    Social History   Social  History  . Marital Status: Married    Spouse Name: N/A  . Number of Children: N/A  . Years of Education: N/A   Social History Main Topics  . Smoking status: Never Smoker   . Smokeless tobacco: Never Used  . Alcohol Use: 0.0 oz/week    0 Standard drinks or equivalent per week     Comment: occ  . Drug Use: No  . Sexual Activity: Not Asked   Other Topics Concern  . None   Social History Narrative   Review of Systems - See HPI.  All other ROS are negative.  BP 118/70 mmHg  Pulse 70  Temp(Src) 98.2 F (36.8 C) (Oral)  Resp 16  Ht 5\' 8"  (1.727 m)  Wt 205 lb 6 oz (93.157 kg)  BMI 31.23 kg/m2  SpO2 97%  Physical Exam  Constitutional: He is oriented to person, place, and time and well-developed, well-nourished, and in no distress.  HENT:  Head: Normocephalic and atraumatic.  Right Ear: Tympanic membrane normal.  Left Ear: Tympanic membrane normal.  Nose: Mucosal edema and rhinorrhea present. Right sinus exhibits maxillary sinus tenderness. Left sinus exhibits maxillary sinus tenderness.  Mouth/Throat: Uvula is midline, oropharynx is clear and moist  and mucous membranes are normal.  Eyes: Conjunctivae are normal.  Neck: Neck supple.  Cardiovascular: Normal rate, regular rhythm, normal heart sounds and intact distal pulses.   Pulmonary/Chest: Effort normal and breath sounds normal. No respiratory distress. He has no wheezes. He has no rales. He exhibits no tenderness.  Neurological: He is alert and oriented to person, place, and time.  Skin: Skin is warm and dry.  Vitals reviewed.  Assessment/Plan: 1. Acute bacterial sinusitis Rx Doxycycline.  Increase fluids.  Rest.  Saline nasal spray.  Probiotic.  Mucinex as directed.  Humidifier in bedroom. Follow-up if not resolving.  Call or return to clinic if symptoms are not improving.  - doxycycline (VIBRAMYCIN) 100 MG capsule; Take 1 capsule (100 mg total) by mouth 2 (two) times daily.  Dispense: 20 capsule; Refill: 0 -  benzonatate (TESSALON) 100 MG capsule; Take 1 capsule (100 mg total) by mouth 3 (three) times daily as needed.  Dispense: 30 capsule; Refill: 0

## 2016-02-05 NOTE — Patient Instructions (Signed)
Please take antibiotic as directed.  Increase fluid intake.  Use Saline nasal spray.  Take a daily multivitamin. Use cough medication as directed.  Place a humidifier in the bedroom.  Please call or return clinic if symptoms are not improving.  Sinusitis Sinusitis is redness, soreness, and swelling (inflammation) of the paranasal sinuses. Paranasal sinuses are air pockets within the bones of your face (beneath the eyes, the middle of the forehead, or above the eyes). In healthy paranasal sinuses, mucus is able to drain out, and air is able to circulate through them by way of your nose. However, when your paranasal sinuses are inflamed, mucus and air can become trapped. This can allow bacteria and other germs to grow and cause infection. Sinusitis can develop quickly and last only a short time (acute) or continue over a long period (chronic). Sinusitis that lasts for more than 12 weeks is considered chronic.  CAUSES  Causes of sinusitis include:  Allergies.  Structural abnormalities, such as displacement of the cartilage that separates your nostrils (deviated septum), which can decrease the air flow through your nose and sinuses and affect sinus drainage.  Functional abnormalities, such as when the small hairs (cilia) that line your sinuses and help remove mucus do not work properly or are not present. SYMPTOMS  Symptoms of acute and chronic sinusitis are the same. The primary symptoms are pain and pressure around the affected sinuses. Other symptoms include:  Upper toothache.  Earache.  Headache.  Bad breath.  Decreased sense of smell and taste.  A cough, which worsens when you are lying flat.  Fatigue.  Fever.  Thick drainage from your nose, which often is green and may contain pus (purulent).  Swelling and warmth over the affected sinuses. DIAGNOSIS  Your caregiver will perform a physical exam. During the exam, your caregiver may:  Look in your nose for signs of abnormal  growths in your nostrils (nasal polyps).  Tap over the affected sinus to check for signs of infection.  View the inside of your sinuses (endoscopy) with a special imaging device with a light attached (endoscope), which is inserted into your sinuses. If your caregiver suspects that you have chronic sinusitis, one or more of the following tests may be recommended:  Allergy tests.  Nasal culture A sample of mucus is taken from your nose and sent to a lab and screened for bacteria.  Nasal cytology A sample of mucus is taken from your nose and examined by your caregiver to determine if your sinusitis is related to an allergy. TREATMENT  Most cases of acute sinusitis are related to a viral infection and will resolve on their own within 10 days. Sometimes medicines are prescribed to help relieve symptoms (pain medicine, decongestants, nasal steroid sprays, or saline sprays).  However, for sinusitis related to a bacterial infection, your caregiver will prescribe antibiotic medicines. These are medicines that will help kill the bacteria causing the infection.  Rarely, sinusitis is caused by a fungal infection. In theses cases, your caregiver will prescribe antifungal medicine. For some cases of chronic sinusitis, surgery is needed. Generally, these are cases in which sinusitis recurs more than 3 times per year, despite other treatments. HOME CARE INSTRUCTIONS   Drink plenty of water. Water helps thin the mucus so your sinuses can drain more easily.  Use a humidifier.  Inhale steam 3 to 4 times a day (for example, sit in the bathroom with the shower running).  Apply a warm, moist washcloth to your face 3   to 4 times a day, or as directed by your caregiver.  Use saline nasal sprays to help moisten and clean your sinuses.  Take over-the-counter or prescription medicines for pain, discomfort, or fever only as directed by your caregiver. SEEK IMMEDIATE MEDICAL CARE IF:  You have increasing pain or  severe headaches.  You have nausea, vomiting, or drowsiness.  You have swelling around your face.  You have vision problems.  You have a stiff neck.  You have difficulty breathing. MAKE SURE YOU:   Understand these instructions.  Will watch your condition.  Will get help right away if you are not doing well or get worse. Document Released: 08/17/2005 Document Revised: 11/09/2011 Document Reviewed: 09/01/2011 [employer] Patient Information 2014 [employer].   

## 2016-02-05 NOTE — Progress Notes (Signed)
Pre visit review using our clinic review tool, if applicable. No additional management support is needed unless otherwise documented below in the visit note/SLS  

## 2016-04-24 DIAGNOSIS — M545 Low back pain: Secondary | ICD-10-CM | POA: Diagnosis not present

## 2016-04-24 DIAGNOSIS — Z79891 Long term (current) use of opiate analgesic: Secondary | ICD-10-CM | POA: Diagnosis not present

## 2016-04-24 DIAGNOSIS — F419 Anxiety disorder, unspecified: Secondary | ICD-10-CM | POA: Diagnosis not present

## 2016-04-24 DIAGNOSIS — M5136 Other intervertebral disc degeneration, lumbar region: Secondary | ICD-10-CM | POA: Diagnosis not present

## 2016-04-24 DIAGNOSIS — M47896 Other spondylosis, lumbar region: Secondary | ICD-10-CM | POA: Diagnosis not present

## 2016-04-24 DIAGNOSIS — N400 Enlarged prostate without lower urinary tract symptoms: Secondary | ICD-10-CM | POA: Diagnosis not present

## 2016-04-24 DIAGNOSIS — R079 Chest pain, unspecified: Secondary | ICD-10-CM | POA: Diagnosis not present

## 2016-04-24 DIAGNOSIS — E119 Type 2 diabetes mellitus without complications: Secondary | ICD-10-CM | POA: Diagnosis not present

## 2016-04-24 DIAGNOSIS — E785 Hyperlipidemia, unspecified: Secondary | ICD-10-CM | POA: Diagnosis not present

## 2016-07-14 DIAGNOSIS — Z79891 Long term (current) use of opiate analgesic: Secondary | ICD-10-CM | POA: Diagnosis not present

## 2016-07-14 DIAGNOSIS — M47896 Other spondylosis, lumbar region: Secondary | ICD-10-CM | POA: Diagnosis not present

## 2016-07-14 DIAGNOSIS — M545 Low back pain: Secondary | ICD-10-CM | POA: Diagnosis not present

## 2016-07-14 DIAGNOSIS — M5126 Other intervertebral disc displacement, lumbar region: Secondary | ICD-10-CM | POA: Diagnosis not present

## 2016-07-16 DIAGNOSIS — L579 Skin changes due to chronic exposure to nonionizing radiation, unspecified: Secondary | ICD-10-CM | POA: Diagnosis not present

## 2016-07-16 DIAGNOSIS — L821 Other seborrheic keratosis: Secondary | ICD-10-CM | POA: Diagnosis not present

## 2016-07-16 DIAGNOSIS — Z85828 Personal history of other malignant neoplasm of skin: Secondary | ICD-10-CM | POA: Diagnosis not present

## 2016-07-16 DIAGNOSIS — L570 Actinic keratosis: Secondary | ICD-10-CM | POA: Diagnosis not present

## 2016-08-20 DIAGNOSIS — G4700 Insomnia, unspecified: Secondary | ICD-10-CM | POA: Diagnosis not present

## 2016-08-20 DIAGNOSIS — F331 Major depressive disorder, recurrent, moderate: Secondary | ICD-10-CM | POA: Diagnosis not present

## 2016-09-14 DIAGNOSIS — M5136 Other intervertebral disc degeneration, lumbar region: Secondary | ICD-10-CM | POA: Diagnosis not present

## 2016-09-14 DIAGNOSIS — M47896 Other spondylosis, lumbar region: Secondary | ICD-10-CM | POA: Diagnosis not present

## 2016-10-29 ENCOUNTER — Other Ambulatory Visit: Payer: Self-pay | Admitting: Physician Assistant

## 2016-11-18 DIAGNOSIS — Z79891 Long term (current) use of opiate analgesic: Secondary | ICD-10-CM | POA: Diagnosis not present

## 2016-11-18 DIAGNOSIS — M47896 Other spondylosis, lumbar region: Secondary | ICD-10-CM | POA: Diagnosis not present

## 2016-11-18 DIAGNOSIS — M545 Low back pain: Secondary | ICD-10-CM | POA: Diagnosis not present

## 2016-11-18 DIAGNOSIS — M5136 Other intervertebral disc degeneration, lumbar region: Secondary | ICD-10-CM | POA: Diagnosis not present

## 2016-12-23 DIAGNOSIS — F329 Major depressive disorder, single episode, unspecified: Secondary | ICD-10-CM | POA: Diagnosis not present

## 2016-12-23 DIAGNOSIS — F411 Generalized anxiety disorder: Secondary | ICD-10-CM | POA: Diagnosis not present

## 2016-12-23 DIAGNOSIS — H6502 Acute serous otitis media, left ear: Secondary | ICD-10-CM | POA: Diagnosis not present

## 2017-01-18 DIAGNOSIS — Z79891 Long term (current) use of opiate analgesic: Secondary | ICD-10-CM | POA: Diagnosis not present

## 2017-01-18 DIAGNOSIS — M5136 Other intervertebral disc degeneration, lumbar region: Secondary | ICD-10-CM | POA: Diagnosis not present

## 2017-01-18 DIAGNOSIS — M545 Low back pain: Secondary | ICD-10-CM | POA: Diagnosis not present

## 2017-01-18 DIAGNOSIS — M47896 Other spondylosis, lumbar region: Secondary | ICD-10-CM | POA: Diagnosis not present

## 2017-01-20 DIAGNOSIS — Z85828 Personal history of other malignant neoplasm of skin: Secondary | ICD-10-CM | POA: Diagnosis not present

## 2017-01-20 DIAGNOSIS — L570 Actinic keratosis: Secondary | ICD-10-CM | POA: Diagnosis not present

## 2017-01-20 DIAGNOSIS — D235 Other benign neoplasm of skin of trunk: Secondary | ICD-10-CM | POA: Diagnosis not present

## 2017-01-20 DIAGNOSIS — L218 Other seborrheic dermatitis: Secondary | ICD-10-CM | POA: Diagnosis not present

## 2017-02-10 DIAGNOSIS — F329 Major depressive disorder, single episode, unspecified: Secondary | ICD-10-CM | POA: Diagnosis not present

## 2017-02-10 DIAGNOSIS — E119 Type 2 diabetes mellitus without complications: Secondary | ICD-10-CM | POA: Diagnosis not present

## 2017-02-10 DIAGNOSIS — Z205 Contact with and (suspected) exposure to viral hepatitis: Secondary | ICD-10-CM | POA: Diagnosis not present

## 2017-02-10 DIAGNOSIS — M47896 Other spondylosis, lumbar region: Secondary | ICD-10-CM | POA: Diagnosis not present

## 2017-02-10 DIAGNOSIS — M5136 Other intervertebral disc degeneration, lumbar region: Secondary | ICD-10-CM | POA: Diagnosis not present

## 2017-02-10 DIAGNOSIS — I10 Essential (primary) hypertension: Secondary | ICD-10-CM | POA: Diagnosis not present

## 2017-03-19 ENCOUNTER — Other Ambulatory Visit: Payer: Self-pay

## 2017-03-22 IMAGING — US US ABDOMEN COMPLETE
1 series · 13 of 25 positions shown · non-contrast
Comparison: None.

CLINICAL DATA: Abdominal pain with nausea for 1 day

EXAM:
ABDOMEN ULTRASOUND COMPLETE

[Series 1: us abdomen complete · 0.18mm/px · 13 of 65 slices shown]
[im 1/65]
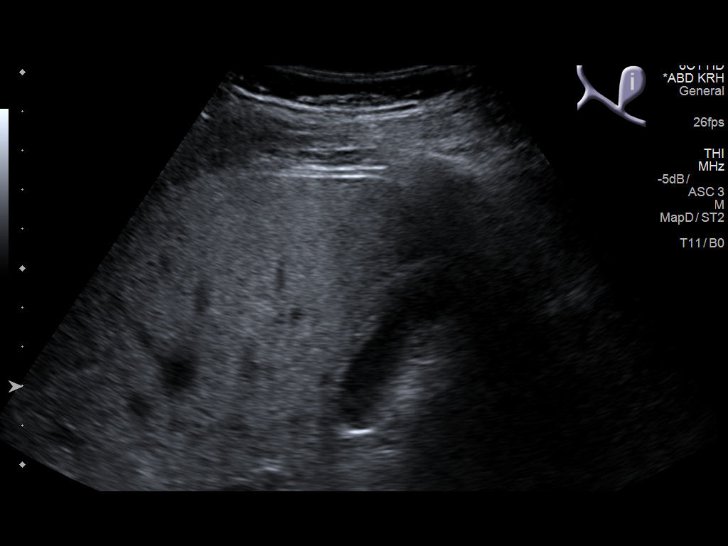
[im 6/65]
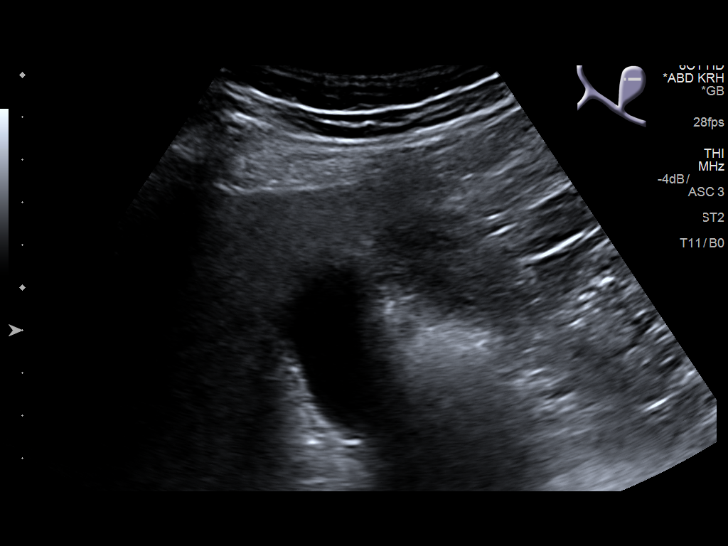
[im 11/65]
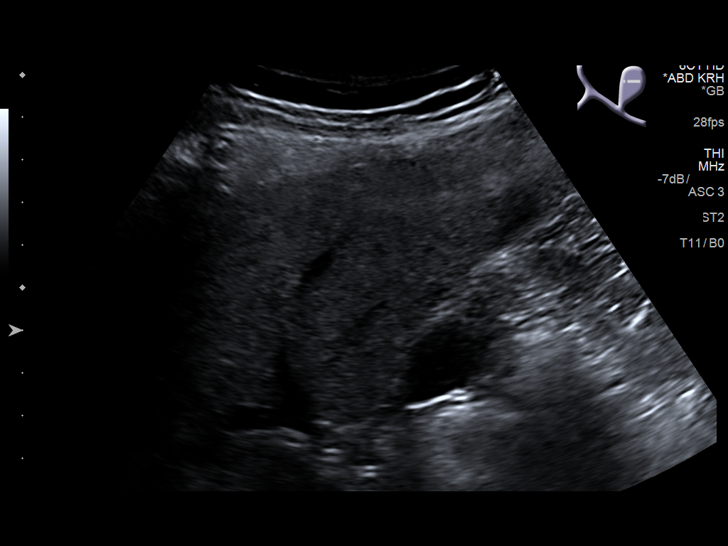
[im 17/65]
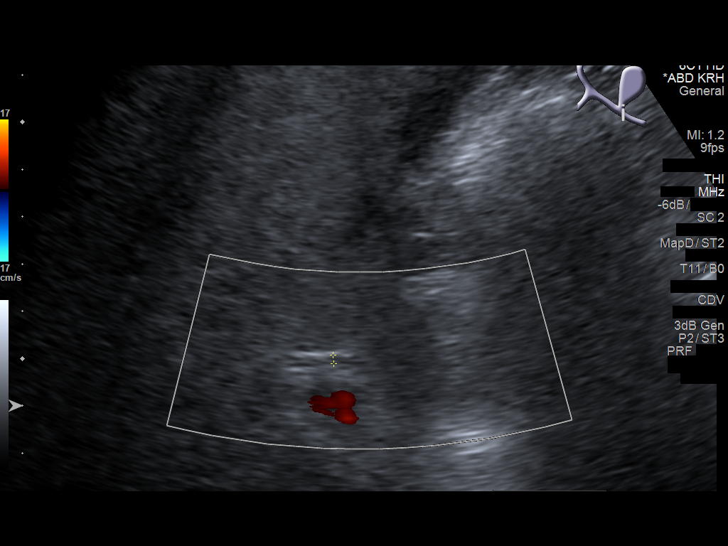
[im 22/65]
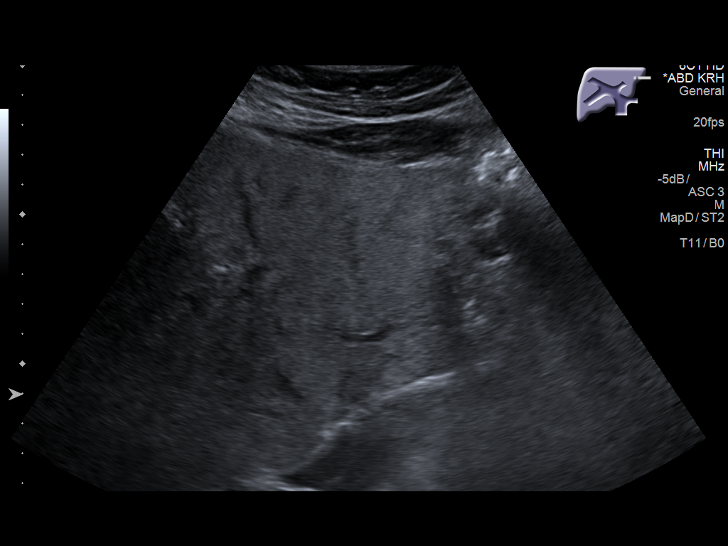
[im 27/65]
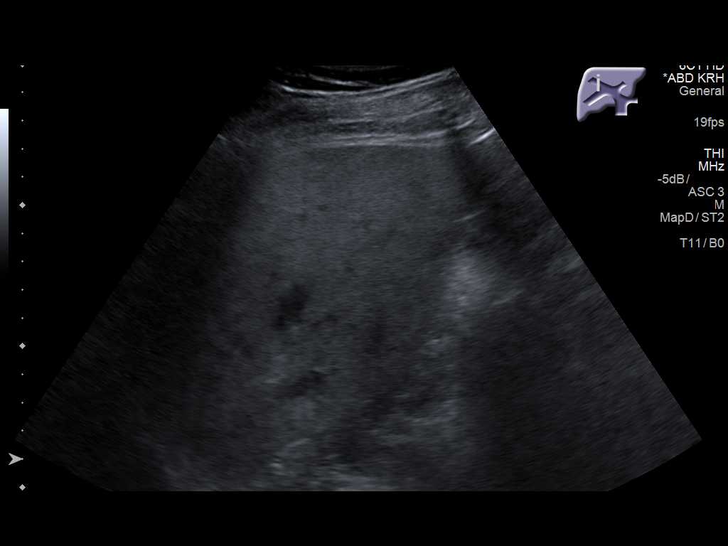
[im 33/65]
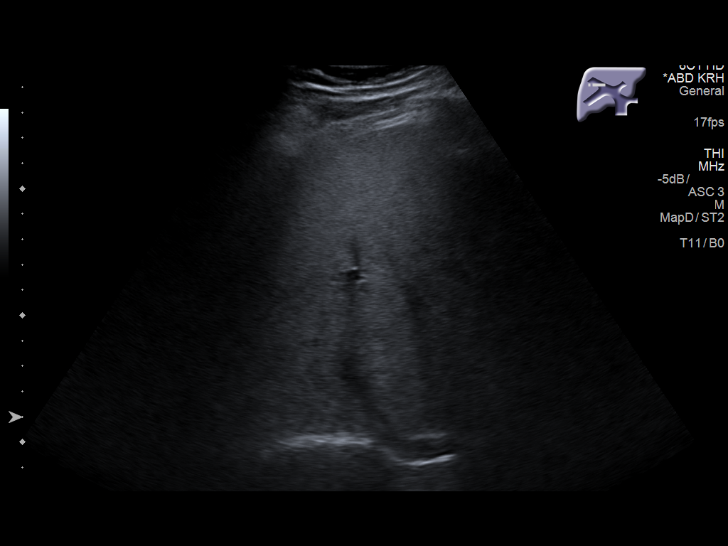
[im 38/65]
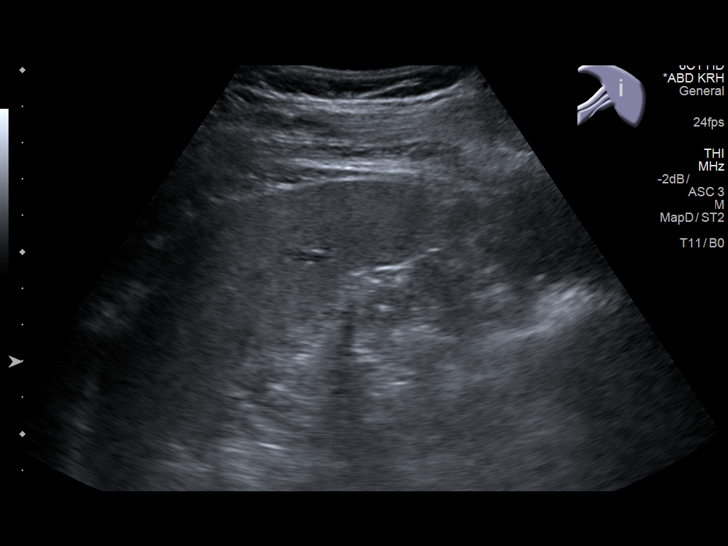
[im 43/65]
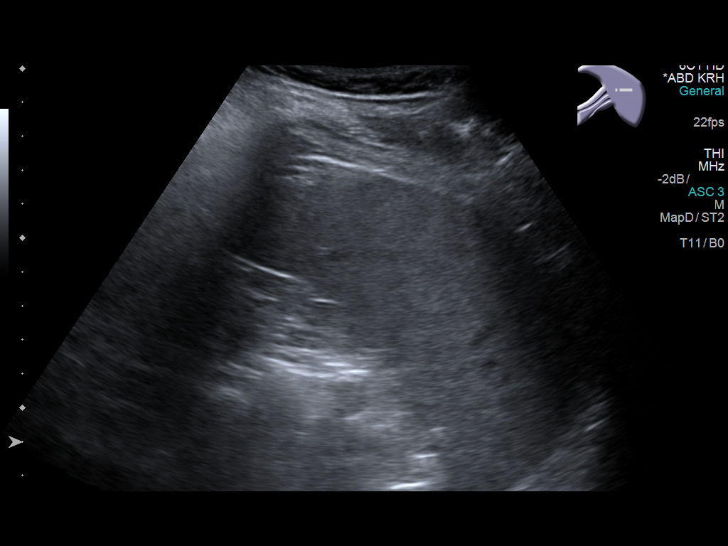
[im 49/65]
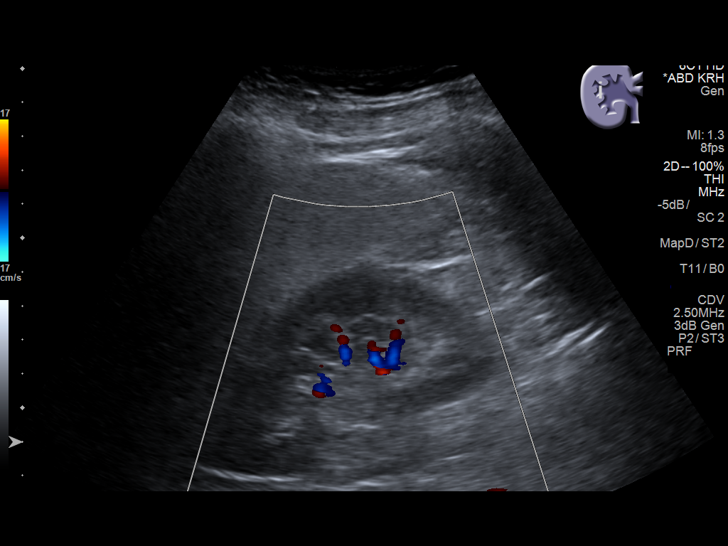
[im 54/65]
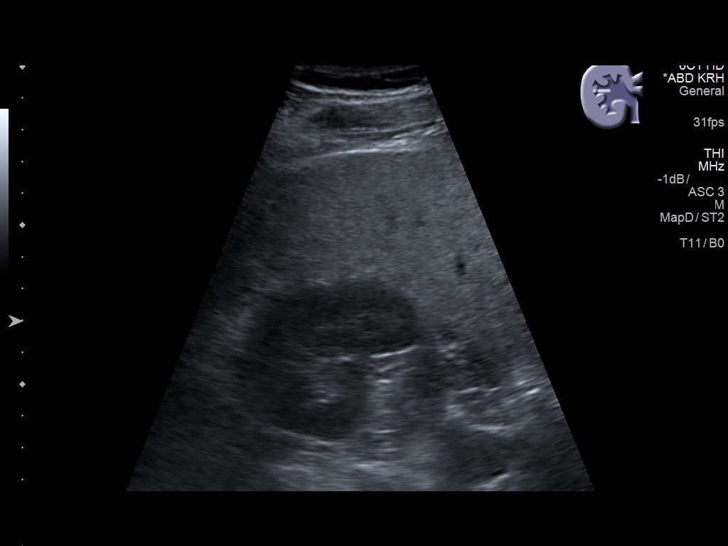
[im 59/65]
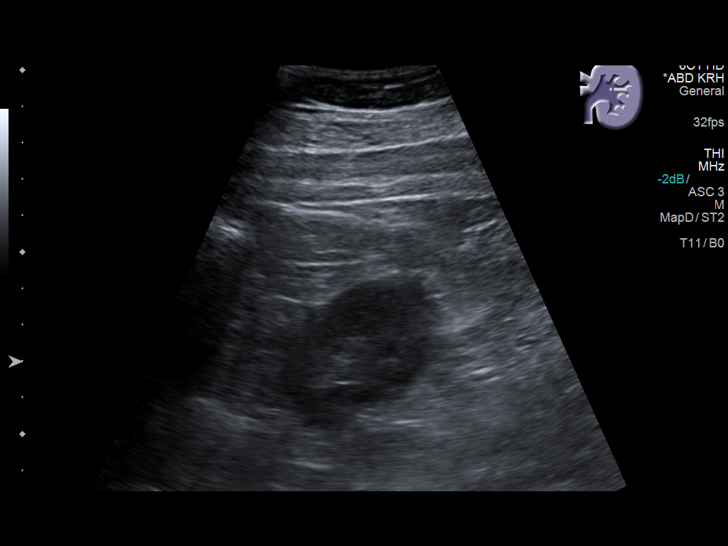
[im 65/65]
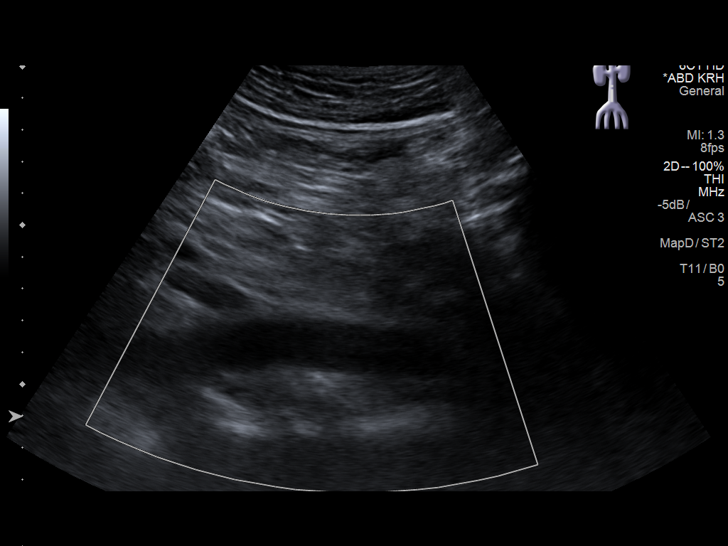

[13 of 25 positions shown; findings below may reference images not displayed]

FINDINGS: Gallbladder: No gallstones or wall thickening visualized. There is
no pericholecystic fluid. No sonographic Murphy sign noted by
sonographer.

Common bile duct: Diameter: 2 mm. There is no intrahepatic, common
hepatic, or common bile duct dilatation.

Liver: No focal lesion identified.  Liver echogenicity is increased.

IVC: No abnormality visualized.

Pancreas: Visualized portion unremarkable. Portions of pancreas are
obscured by gas.

Spleen: Size and appearance within normal limits.

Right Kidney: Length: 10.1 cm. Echogenicity within normal limits. No
mass or hydronephrosis visualized.

Left Kidney: Length: 10.2 cm. Echogenicity within normal limits. No
mass or hydronephrosis visualized.

Abdominal aorta: No aneurysm visualized.

Other findings: No demonstrable ascites.
IMPRESSION: Portions of pancreas obscured by gas. Visualized portions of
pancreas appear normal. Liver echogenicity is increased, a finding
most likely due to hepatic steatosis. While no focal liver lesions
are identified, it must be cautioned that the sensitivity of
ultrasound for focal liver lesions is diminished in this
circumstance. Study otherwise unremarkable.

## 2017-03-29 DIAGNOSIS — M47896 Other spondylosis, lumbar region: Secondary | ICD-10-CM | POA: Diagnosis not present

## 2017-03-29 DIAGNOSIS — M545 Low back pain: Secondary | ICD-10-CM | POA: Diagnosis not present

## 2017-03-29 DIAGNOSIS — M5136 Other intervertebral disc degeneration, lumbar region: Secondary | ICD-10-CM | POA: Diagnosis not present

## 2017-03-29 DIAGNOSIS — Z79891 Long term (current) use of opiate analgesic: Secondary | ICD-10-CM | POA: Diagnosis not present

## 2017-06-08 DIAGNOSIS — M47896 Other spondylosis, lumbar region: Secondary | ICD-10-CM | POA: Diagnosis not present

## 2017-06-08 DIAGNOSIS — M5136 Other intervertebral disc degeneration, lumbar region: Secondary | ICD-10-CM | POA: Diagnosis not present

## 2017-08-06 DIAGNOSIS — M5136 Other intervertebral disc degeneration, lumbar region: Secondary | ICD-10-CM | POA: Diagnosis not present

## 2017-08-06 DIAGNOSIS — Z79899 Other long term (current) drug therapy: Secondary | ICD-10-CM | POA: Diagnosis not present

## 2017-08-09 DIAGNOSIS — M5136 Other intervertebral disc degeneration, lumbar region: Secondary | ICD-10-CM | POA: Diagnosis not present

## 2017-08-09 DIAGNOSIS — E119 Type 2 diabetes mellitus without complications: Secondary | ICD-10-CM | POA: Diagnosis not present

## 2017-08-09 DIAGNOSIS — M47896 Other spondylosis, lumbar region: Secondary | ICD-10-CM | POA: Diagnosis not present

## 2017-11-21 IMAGING — CR DG CHEST 2V
2 series · 2 of 2 positions shown · non-contrast
Comparison: None.

CLINICAL DATA: Sweating and dizziness

EXAM:
CHEST  2 VIEW

[w chest pa]
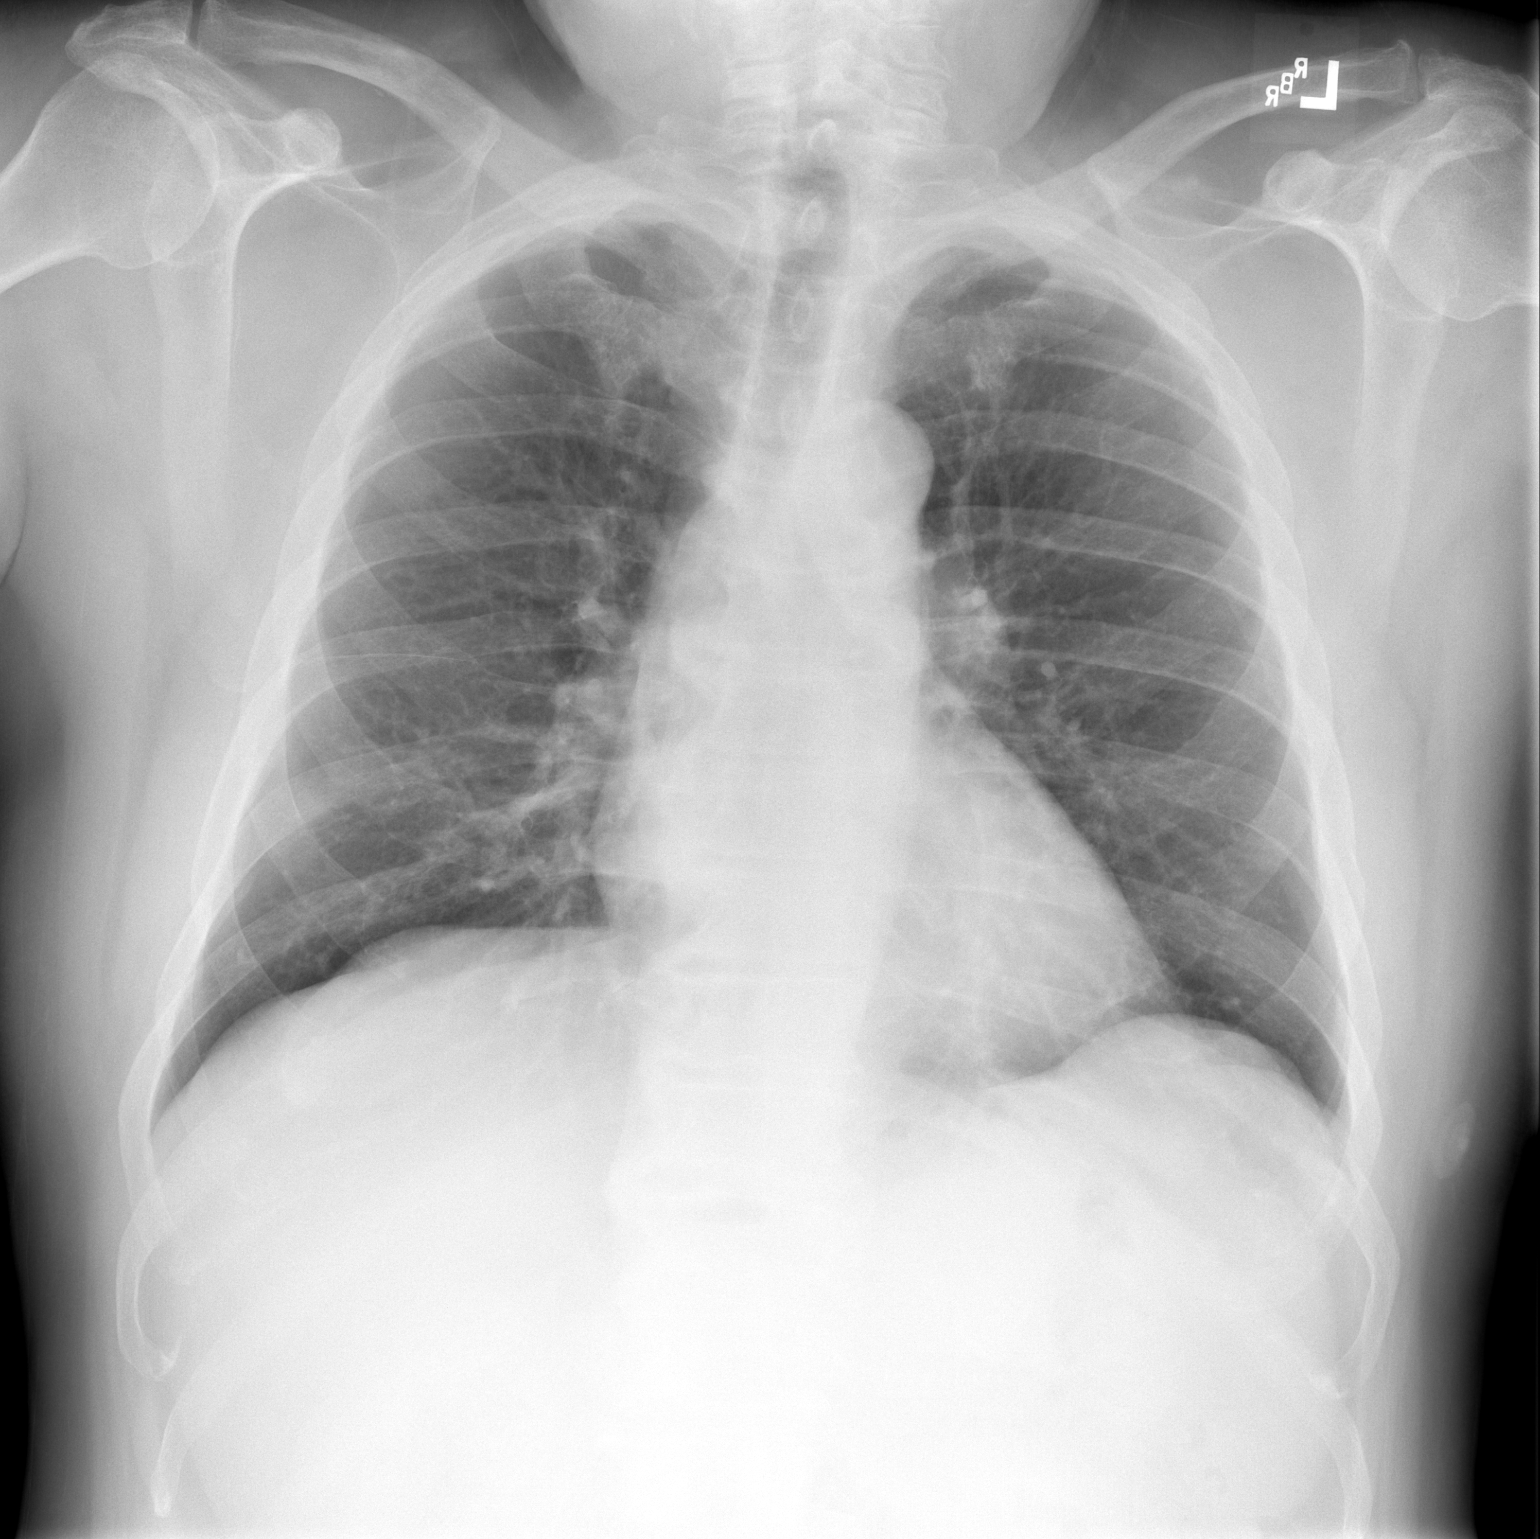

[w chest lat]
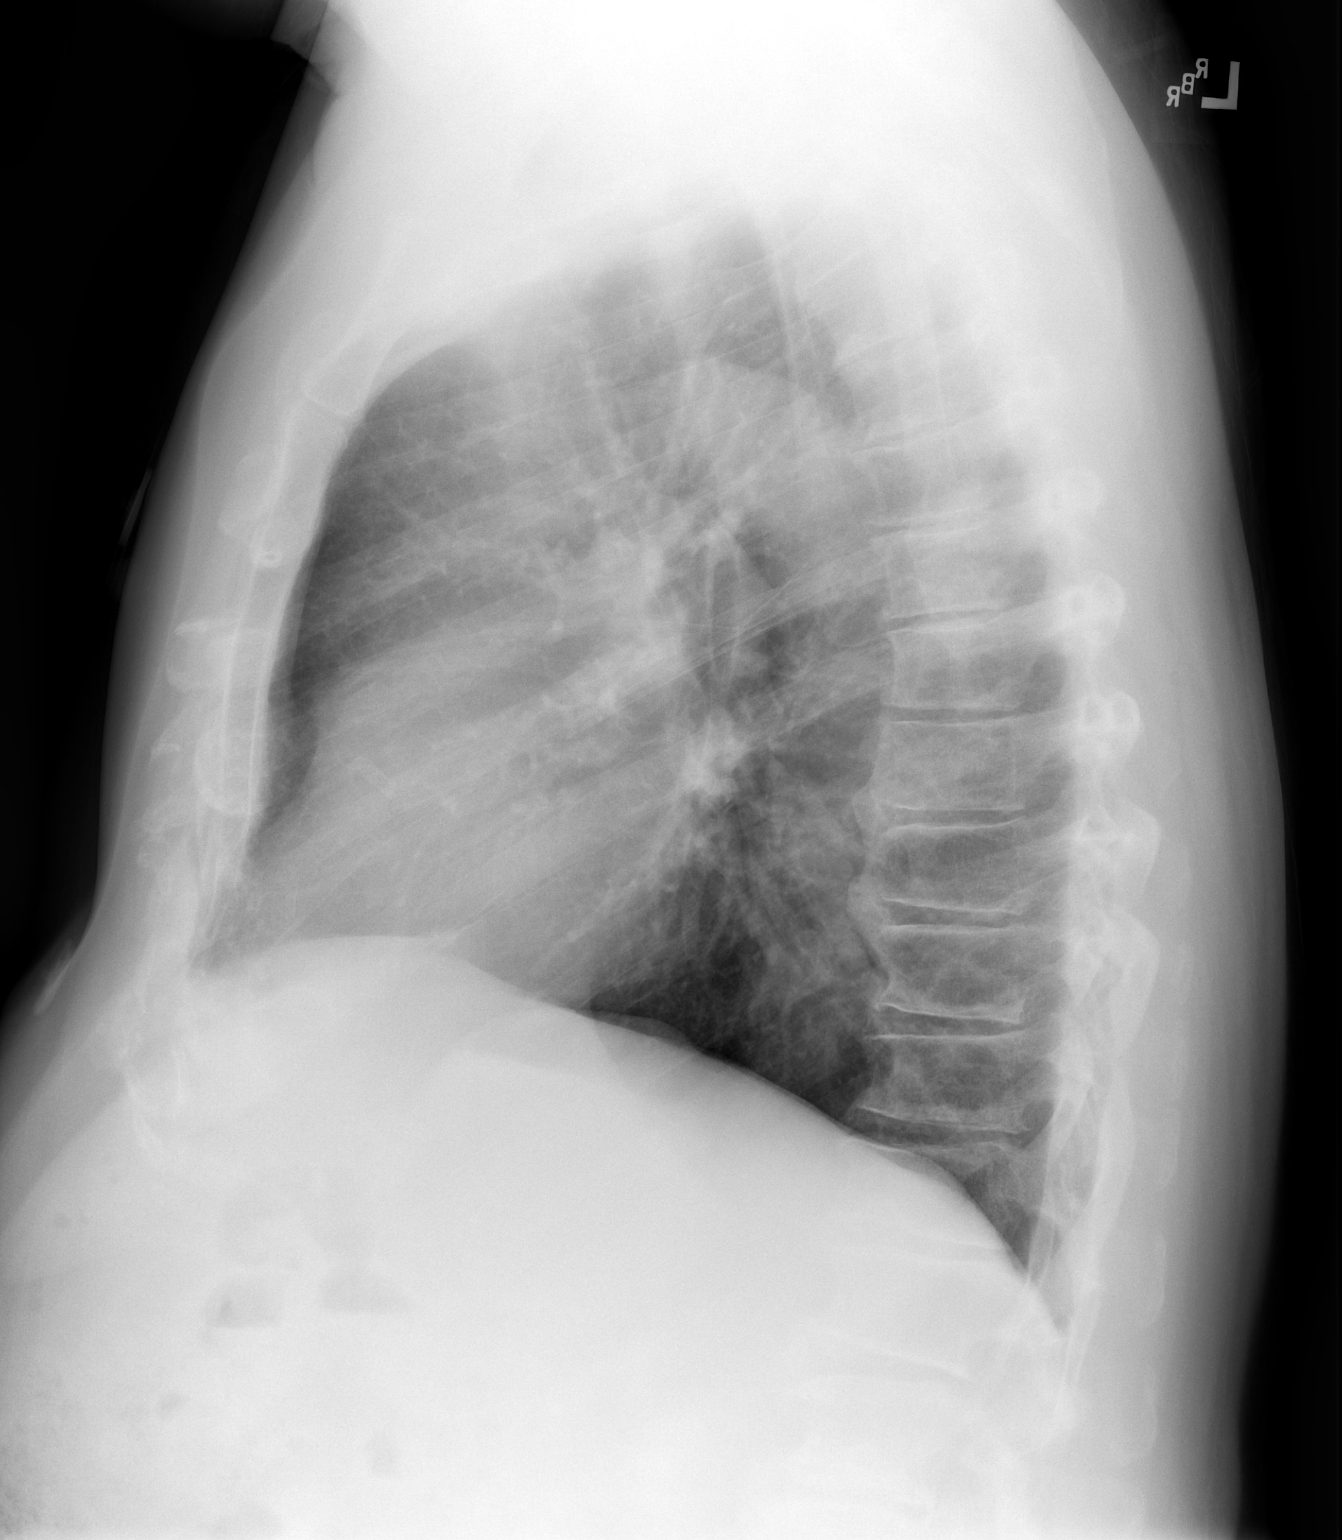

[2 of 2 positions shown; findings below may reference images not displayed]

FINDINGS: Lungs are clear. Heart size and pulmonary vascularity are normal. No
adenopathy. There is degenerative change in the lower thoracic
spine.
IMPRESSION: No edema or consolidation.

## 2018-01-19 ENCOUNTER — Encounter: Payer: Self-pay | Admitting: General Practice

## 2023-01-18 ENCOUNTER — Encounter (HOSPITAL_COMMUNITY): Payer: Self-pay

## 2023-01-18 ENCOUNTER — Other Ambulatory Visit: Payer: Self-pay

## 2023-01-18 ENCOUNTER — Emergency Department (HOSPITAL_COMMUNITY): Payer: Commercial Managed Care - PPO

## 2023-01-18 ENCOUNTER — Emergency Department
Admission: EM | Admit: 2023-01-18 | Discharge: 2023-01-18 | Disposition: A | Discharge status: Other Acute Inpatient Hospital | Payer: Commercial Managed Care - PPO | Attending: Emergency Medicine | Admitting: Emergency Medicine

## 2023-01-18 DIAGNOSIS — R0989 Other specified symptoms and signs involving the circulatory and respiratory systems: Secondary | ICD-10-CM

## 2023-01-18 DIAGNOSIS — R2990 Unspecified symptoms and signs involving the nervous system: Secondary | ICD-10-CM | POA: Insufficient documentation

## 2023-01-18 DIAGNOSIS — R531 Weakness: Secondary | ICD-10-CM

## 2023-01-18 DIAGNOSIS — Z1152 Encounter for screening for COVID-19: Secondary | ICD-10-CM | POA: Insufficient documentation

## 2023-01-18 HISTORY — DX: Chronic obstructive pulmonary disease, unspecified (CMS HCC): J44.9

## 2023-01-18 HISTORY — DX: Gastro-esophageal reflux disease without esophagitis: K21.9

## 2023-01-18 HISTORY — DX: Type 2 diabetes mellitus without complications (CMS HCC): E11.9

## 2023-01-18 LAB — COMPREHENSIVE METABOLIC PANEL, NON-FASTING
ALBUMIN: 3.9 g/dL (ref 3.4–4.8)
ALKALINE PHOSPHATASE: 87 U/L (ref 45–115)
ALT (SGPT): 21 U/L (ref 10–55)
ANION GAP: 9 mmol/L (ref 4–13)
AST (SGOT): 21 U/L (ref 8–45)
BILIRUBIN TOTAL: 1 mg/dL (ref 0.3–1.3)
BUN/CREA RATIO: 11 (ref 6–22)
BUN: 12 mg/dL (ref 8–25)
CALCIUM: 9.2 mg/dL (ref 8.6–10.3)
CHLORIDE: 105 mmol/L (ref 96–111)
CO2 TOTAL: 22 mmol/L — ABNORMAL LOW (ref 23–31)
CREATININE: 1.1 mg/dL (ref 0.75–1.35)
ESTIMATED GFR - MALE: 71 mL/min/BSA (ref >=60)
GLUCOSE: 138 mg/dL — ABNORMAL HIGH (ref 65–125)
POTASSIUM: 4.2 mmol/L (ref 3.5–5.1)
PROTEIN TOTAL: 7.9 g/dL (ref 6.0–8.0)
SODIUM: 136 mmol/L (ref 136–145)

## 2023-01-18 LAB — CBC WITH DIFF
BASOPHIL #: <0.1 10*3/uL (ref <=0.20)
BASOPHIL %: 1 %
EOSINOPHIL #: <0.1 10*3/uL (ref <=0.50)
EOSINOPHIL %: 1 %
HCT: 46.1 % (ref 38.9–52.0)
HGB: 15.1 g/dL (ref 13.4–17.5)
IMMATURE GRANULOCYTE #: <0.1 10*3/uL (ref <0.10)
IMMATURE GRANULOCYTE %: 0 % (ref 0.0–1.0)
LYMPHOCYTE #: 1.46 10*3/uL (ref 1.00–4.80)
LYMPHOCYTE %: 21 %
MCH: 28.9 pg (ref 26.0–32.0)
MCHC: 32.8 g/dL (ref 31.0–35.5)
MCV: 88.1 fL (ref 78.0–100.0)
MONOCYTE #: 0.64 10*3/uL (ref 0.20–1.10)
MONOCYTE %: 9 %
MPV: 10.1 fL (ref 8.7–12.5)
NEUTROPHIL #: 4.76 10*3/uL (ref 1.50–7.70)
NEUTROPHIL %: 68 %
PLATELETS: 202 10*3/uL (ref 150–400)
RBC: 5.23 10*6/uL (ref 4.50–6.10)
RDW-CV: 13.9 % (ref 11.5–15.5)
WBC: 7 10*3/uL (ref 3.7–11.0)

## 2023-01-18 LAB — COVID-19 ~~LOC~~ MOLECULAR LAB TESTING
INFLUENZA VIRUS TYPE A: NOT DETECTED
INFLUENZA VIRUS TYPE B: NOT DETECTED
RESPIRATORY SYNCTIAL VIRUS (RSV): NOT DETECTED
SARS-CoV-2: NOT DETECTED

## 2023-01-18 LAB — BLUE TOP TUBE

## 2023-01-18 LAB — GOLD TOP TUBE

## 2023-01-18 LAB — POC BLOOD GLUCOSE (RESULTS): GLUCOSE, POC: 139 mg/dl — ABNORMAL HIGH (ref 60–110)

## 2023-01-18 LAB — LACTIC ACID LEVEL W/ REFLEX FOR LEVEL >2.0: LACTIC ACID: 1.3 mmol/L (ref 0.5–2.2)

## 2023-01-18 LAB — TROPONIN-I: TROPONIN-I HS: <2.7 ng/L (ref <=35.0)

## 2023-01-18 LAB — THYROID STIMULATING HORMONE (SENSITIVE TSH): TSH: 1.695 u[IU]/mL (ref 0.350–4.940)

## 2023-01-18 MED ORDER — ASPIRIN 325 MG TABLET,DELAYED RELEASE
325.0000 mg | DELAYED_RELEASE_TABLET | ORAL | Status: AC
Start: 2023-01-18 — End: 2023-01-18
  Administered 2023-01-18: 325 mg via ORAL
  Filled 2023-01-18: qty 1

## 2023-01-18 MED ORDER — PANTOPRAZOLE 40 MG INTRAVENOUS SOLUTION
40.0000 mg | INTRAVENOUS | Status: AC
Start: 2023-01-18 — End: 2023-01-18
  Administered 2023-01-18: 40 mg via INTRAVENOUS
  Filled 2023-01-18: qty 10

## 2023-01-18 MED ORDER — FAMOTIDINE (PF) 20 MG/2 ML INTRAVENOUS SOLUTION
20.0000 mg | INTRAVENOUS | Status: AC
Start: 2023-01-18 — End: 2023-01-18
  Administered 2023-01-18: 20 mg via INTRAVENOUS
  Filled 2023-01-18: qty 2

## 2023-01-18 NOTE — ED Nurses Note (Signed)
Report to Alleen Borne at [facility].

## 2023-01-18 NOTE — ED Notes (Signed)
Dr Jonah Blue called [facility] to request patient transfer.      [id number]  [facility] called ED; patient accepted to room number 4709 with Dr Deanna Artis as accepting physician.

## 2023-01-18 NOTE — ED Nurses Note (Signed)
Took 100% of diabetic diet

## 2023-01-18 NOTE — ED Provider Notes (Signed)
Department of Emergency Medicine  [facility]  01/18/2023        Patient is a 72 y.o. male presenting to the ED with chief complaint of Weakness (left)      HPI   This is a 72 year old male who presented to the ED with the above complaint.  The patient is accompanied by family who does help provide some of the history.  Patient is apparently normally independent and able to perform his daily activities.  Family member states that he has been having some left-sided weakness for greater than 24 hours.  Reports he actually found the patient down in the garage yesterday.  Patient reported he had been lying there for approximately 20 minutes and was unable to get up without assistance.  Family was able to assist him up and noted that his left side was significantly weaker than the right.  They also reported a shuffling gait.  Patient has been attempting to ambulate since then with the use of his walker and family states that he does not even hardly use his left arm on the walker.  Patient himself just reports some fatigue and weakness.  Family states they do not know that he is aware that he has the left-sided weakness.    Past Medical History:   Diagnosis Date    COPD (chronic obstructive pulmonary disease) (CMS HCC)     Diabetes mellitus, type 2 (CMS HCC)     GERD (gastroesophageal reflux disease)      Past Surgical History:   Procedure Laterality Date    Hx back surgery      Hx hip replacement Right      No Known Allergies   Current Outpatient Medications   Medication Sig    aspirin 325 mg Oral Tablet Take 1 Tablet (325 mg total) by mouth Once a day    budesonide-glycopyr-formoterol (BREZTRI AEROSPHERE) 160-9-4.8 mcg/actuation Inhalation HFA Aerosol Inhaler Take 2 Puffs by inhalation Twice daily    clobetasoL (TEMOVATE) 0.05 % Cream Apply topically Twice daily    donepeziL (ARICEPT) 10 mg Oral Tablet Take 1 Tablet (10 mg total) by mouth Every night    dutasteride (AVODART) 0.5 mg Oral Capsule Take 1  Capsule (0.5 mg total) by mouth Once a day    glimepiride (AMARYL) 4 mg Oral Tablet Take 1 Tablet (4 mg total) by mouth Every morning with breakfast    metFORMIN (GLUCOPHAGE) 850 mg Oral Tablet Take 1 Tablet (850 mg total) by mouth Twice daily with food    pantoprazole (PROTONIX) 40 mg Oral Tablet, Delayed Release (E.C.) Take 1 Tablet (40 mg total) by mouth Once a day    pioglitazone (ACTOS) 45 mg Oral Tablet Take 1 Tablet (45 mg total) by mouth Once a day    tamsulosin (FLOMAX) 0.4 mg Oral Capsule Take 2 Capsules (0.8 mg total) by mouth Once a day    tiotropium bromide (SPIRIVA RESPIMAT) 2.5 mcg/actuation Inhalation oral inhaler Take 2 Inhalations (2 Puffs total) by inhalation Once a day      Above history reviewed.    Review of Systems   Unable to perform ROS: Acuity of condition   Constitutional:  Negative for chills and fever.   Neurological:  Positive for focal weakness. Negative for dizziness, tingling, sensory change, speech change, loss of consciousness and headaches.            Filed Vitals:    01/18/23 1530 01/18/23 1545 01/18/23 1630 01/18/23 1845   BP: (!) 143/85 Marland Kitchen)  146/120 (!) 123/103 (!) 127/91   Pulse: 73  70 89   Resp: 18  18 18    Temp:       SpO2: 97% 99% 99% 96%       Physical Exam  Vitals reviewed.   Constitutional:       Appearance: Normal appearance.      Comments: This is a 72 year old male seen sitting upright in bed.  He is conscious alert and oriented, GCS 15.  No slurred speech or conversational dyspnea.   HENT:      Head: Normocephalic and atraumatic.      Mouth/Throat:      Mouth: Mucous membranes are moist.      Pharynx: Oropharynx is clear.   Eyes:      Conjunctiva/sclera: Conjunctivae normal.      Pupils: Pupils are equal, round, and reactive to light.   Cardiovascular:      Rate and Rhythm: Normal rate and regular rhythm.      Pulses:           Radial pulses are 2+ on the right side and 2+ on the left side.      Heart sounds: Normal heart sounds.   Pulmonary:      Effort: Pulmonary  effort is normal.      Breath sounds: Normal breath sounds.   Musculoskeletal:      Right lower leg: No edema.      Left lower leg: No edema.   Skin:     General: Skin is warm and dry.   Neurological:      Mental Status: He is alert and oriented to person, place, and time.      GCS: GCS eye subscore is 4. GCS verbal subscore is 5. GCS motor subscore is 6.      Cranial Nerves: No dysarthria or facial asymmetry.      Sensory: Sensation is intact.      Motor: Weakness present. No tremor, abnormal muscle tone or pronator drift.      Comments: NIHSS 2  Left hemiparesis and shuffling gait   Psychiatric:         Behavior: Behavior normal.            Workup:     Results for orders placed or performed during the hospital encounter of 01/18/23 (from the past 24 hour(s))   CBC/DIFF    Narrative    The following orders were created for panel order CBC/DIFF.  Procedure                               Abnormality         Status                     ---------                               -----------         ------                     CBC WITH ZOXW[[id number]]                                    Final result  Please view results for these tests on the individual orders.   COMPREHENSIVE METABOLIC PANEL, NON-FASTING   Result Value Ref Range    SODIUM 136 136 - 145 mmol/L    POTASSIUM 4.2 3.5 - 5.1 mmol/L    CHLORIDE 105 96 - 111 mmol/L    CO2 TOTAL 22 (L) 23 - 31 mmol/L    ANION GAP 9 4 - 13 mmol/L    BUN 12 8 - 25 mg/dL    CREATININE 0.86 5.78 - 1.35 mg/dL    BUN/CREA RATIO 11 6 - 22    ALBUMIN 3.9 3.4 - 4.8 g/dL     CALCIUM 9.2 8.6 - 46.9 mg/dL    GLUCOSE 629 (H) 65 - 125 mg/dL    ALKALINE PHOSPHATASE 87 45 - 115 U/L    ALT (SGPT) 21 10 - 55 U/L    AST (SGOT)  21 8 - 45 U/L    BILIRUBIN TOTAL 1.0 0.3 - 1.3 mg/dL    PROTEIN TOTAL 7.9 6.0 - 8.0 g/dL    ESTIMATED GFR - MALE 71 >=60 mL/min/BSA   TROPONIN-I   Result Value Ref Range    TROPONIN-I HS <2.7 <=35.0 ng/L ng/L   THYROID STIMULATING HORMONE (SENSITIVE TSH)   Result  Value Ref Range    TSH 1.695 0.350 - 4.940 uIU/mL   LACTIC ACID LEVEL W/ REFLEX FOR LEVEL >2.0   Result Value Ref Range    LACTIC ACID 1.3 0.5 - 2.2 mmol/L   CBC WITH DIFF   Result Value Ref Range    WBC 7.0 3.7 - 11.0 x10^3/uL    RBC 5.23 4.50 - 6.10 x10^6/uL    HGB 15.1 13.4 - 17.5 g/dL    HCT 52.8 41.3 - 24.4 %    MCV 88.1 78.0 - 100.0 fL    MCH 28.9 26.0 - 32.0 pg    MCHC 32.8 31.0 - 35.5 g/dL    RDW-CV 01.0 27.2 - 53.6 %    PLATELETS 202 150 - 400 x10^3/uL    MPV 10.1 8.7 - 12.5 fL    NEUTROPHIL % 68.0 %    LYMPHOCYTE % 21.0 %    MONOCYTE % 9.0 %    EOSINOPHIL % 1.0 %    BASOPHIL % 1.0 %    NEUTROPHIL # 4.76 1.50 - 7.70 x10^3/uL    LYMPHOCYTE # 1.46 1.00 - 4.80 x10^3/uL    MONOCYTE # 0.64 0.20 - 1.10 x10^3/uL    EOSINOPHIL # <0.10 <=0.50 x10^3/uL    BASOPHIL # <0.10 <=0.20 x10^3/uL    IMMATURE GRANULOCYTE % 0.0 0.0 - 1.0 %    IMMATURE GRANULOCYTE # <0.10 <0.10 x10^3/uL   EXTRA TUBES    Narrative    The following orders were created for panel order EXTRA TUBES.  Procedure                               Abnormality         Status                     ---------                               -----------         ------  BLUE TOP [id number]                                    Final result               GOLD TOP DGUY[[id number]]                                    Final result                 Please view results for these tests on the individual orders.   BLUE TOP TUBE   Result Value Ref Range    RAINBOW/EXTRA TUBE AUTO RESULT Yes    GOLD TOP TUBE   Result Value Ref Range    RAINBOW/EXTRA TUBE AUTO RESULT Yes    EXTRA TUBES    Narrative    The following orders were created for panel order EXTRA TUBES.  Procedure                               Abnormality         Status                     ---------                               -----------         ------                     BLUE TOP DGLO[[id number]]                                    Final result               GOLD TOP JOAC[[id number]]                                     Final result                 Please view results for these tests on the individual orders.   BLUE TOP TUBE   Result Value Ref Range    RAINBOW/EXTRA TUBE AUTO RESULT Yes    GOLD TOP TUBE   Result Value Ref Range    RAINBOW/EXTRA TUBE AUTO RESULT Yes    COVID-19 SCREENING - Asymptomatic   Result Value Ref Range    SARS-CoV-2 Not Detected Not Detected    INFLUENZA VIRUS TYPE A Not Detected Not Detected    INFLUENZA VIRUS TYPE B Not Detected Not Detected    RESPIRATORY SYNCTIAL VIRUS (RSV) Not Detected Not Detected    Narrative    Results are for the simultaneous qualitative identification of SARS-CoV-2 (formerly 2019-nCoV), Influenza A, Influenza B, and RSV RNA. These etiologic agents are generally detectable in nasopharyngeal and nasal swabs during the ACUTE PHASE of infection. Hence, this test is intended to be performed on respiratory specimens collected from individuals with signs and symptoms of upper respiratory tract infection who meet Centers for Disease Control and Prevention (CDC) clinical and/or epidemiological criteria for Coronavirus Disease 2019 (  COVID-19) testing. CDC COVID-19 criteria for testing on human specimens is available at [facility] webpage information for Healthcare Professionals: Coronavirus Disease 2019 (COVID-19) ([email or web address]).     False-negative results may occur if the virus has genomic mutations, insertions, deletions, or rearrangements or if performed very early in the course of illness. Otherwise, negative results indicate virus specific RNA targets are not detected, however negative results do not preclude SARS-CoV-2 infection/COVID-19, Influenza, or Respiratory syncytial virus infection. Results should not be used as the sole basis for patient management decisions. Negative results must be combined with clinical observations, patient history, and epidemiological information. If upper respiratory tract infection is still  suspected based on exposure history together with other clinical findings, re-testing should be considered.    Disclaimer:   This assay has been authorized by FDA under an Emergency Use Authorization for use in laboratories certified under the Clinical Laboratory Improvement Amendments of 1988 (CLIA), 42 U.S.C. 4198119249, to perform high complexity tests. The impacts of vaccines, antiviral therapeutics, antibiotics, chemotherapeutic or immunosuppressant drugs have not been evaluated.     Test methodology:   Cepheid Xpert Xpress SARS-CoV-2/Flu/RSV Assay real-time polymerase chain reaction (RT-PCR) test on the GeneXpert Dx and Xpert Xpress systems.   POC BLOOD GLUCOSE (RESULTS)   Result Value Ref Range    GLUCOSE, POC 139 (H) 60 - 110 mg/dl       Results for orders placed or performed during the hospital encounter of 01/18/23 (from the past 72 hour(s))   CT BRAIN WO IV CONTRAST     Status: None    Narrative    Sol Passer Lucado  Male, 72 years old.    CT BRAIN WO IV CONTRAST performed on 01/18/2023 11:19 AM.    REASON FOR EXAM:  Weakness    RADIATION DOSE: 965.90 mGycm    TECHNIQUE:    COMPARISON: None    FINDINGS:  There is moderate chronic generalized volume loss and extensive periventricular Murthy matter lucency. I don't see evidence for intracranial hemorrhage or mass effect or acute stroke. Paranasal sinuses are clear.      Impression    Volume loss and Wiest matter change. No acute hemorrhage or mass effect or stroke seen.      Radiologist location ID: [id number]     XR CHEST AP     Status: None    Narrative    Sol Passer Guillen  Male, 72 years old.    XR CHEST AP performed on 01/18/2023 12:11 PM.    REASON FOR EXAM:  Weakness    TECHNIQUE: 1 views/1 images submitted for interpretation.    COMPARISON:  None      Impression    No pneumonia or pneumothorax or pulmonary edema seen.      Radiologist location ID: [id number]         Orders Placed This Encounter    XR CHEST AP    CT BRAIN WO IV CONTRAST    CBC/DIFF     COMPREHENSIVE METABOLIC PANEL, NON-FASTING    TROPONIN-I    THYROID STIMULATING HORMONE (SENSITIVE TSH)    LACTIC ACID LEVEL W/ REFLEX FOR LEVEL >2.0    CBC WITH DIFF    EXTRA TUBES    BLUE TOP TUBE    GOLD TOP TUBE    EXTRA TUBES    BLUE TOP TUBE    GOLD TOP TUBE    COVID-19 SCREENING - Asymptomatic    ECG 12 LEAD    PERFORM POC WHOLE BLOOD GLUCOSE  INSERT & MAINTAIN PERIPHERAL IV ACCESS    aspirin (ECOTRIN) enteric coated tablet 325 mg       ECG:  Normal sinus rhythm with a rate of 74, normal axis, PR interval 176, QRS 70, QTC 437    Abnormal Lab results:  Labs Ordered/Reviewed   COMPREHENSIVE METABOLIC PANEL, NON-FASTING - Abnormal; Notable for the following components:       Result Value    CO2 TOTAL 22 (*)     GLUCOSE 138 (*)     All other components within normal limits   POC BLOOD GLUCOSE (RESULTS) - Abnormal; Notable for the following components:    GLUCOSE, POC 139 (*)     All other components within normal limits   TROPONIN-I - Normal   THYROID STIMULATING HORMONE (SENSITIVE TSH) - Normal   LACTIC ACID LEVEL W/ REFLEX FOR LEVEL >2.0 - Normal   COVID-19 [place] [facility]LECULAR LAB TESTING - Normal    Narrative:     Results are for the simultaneous qualitative identification of SARS-CoV-2 (formerly 2019-nCoV), Influenza A, Influenza B, and RSV RNA. These etiologic agents are generally detectable in nasopharyngeal and nasal swabs during the ACUTE PHASE of infection. Hence, this test is intended to be performed on respiratory specimens collected from individuals with signs and symptoms of upper respiratory tract infection who meet Centers for Disease Control and Prevention (CDC) clinical and/or epidemiological criteria for Coronavirus Disease 2019 (COVID-19) testing. CDC COVID-19 criteria for testing on human specimens is available at [facility] webpage information for Healthcare Professionals: Coronavirus Disease 2019 (COVID-19) ([email or web address]).                                      False-negative results may occur if the virus has genomic mutations, insertions, deletions, or rearrangements or if performed very early in the course of illness. Otherwise, negative results indicate virus specific RNA targets are not detected, however negative results do not preclude SARS-CoV-2 infection/COVID-19, Influenza, or Respiratory syncytial virus infection. Results should not be used as the sole basis for patient management decisions. Negative results must be combined with clinical observations, patient history, and epidemiological information. If upper respiratory tract infection is still suspected based on exposure history together with other clinical findings, re-testing should be considered.                                    Disclaimer:                   This assay has been authorized by FDA under an Emergency Use Authorization for use in laboratories certified under the Clinical Laboratory Improvement Amendments of 1988 (CLIA), 42 U.S.C. 307 274 5481, to perform high complexity tests. The impacts of vaccines, antiviral therapeutics, antibiotics, chemotherapeutic or immunosuppressant drugs have not been evaluated.                                     Test methodology:                   Cepheid Xpert Xpress SARS-CoV-2/Flu/RSV Assay real-time polymerase chain reaction (RT-PCR) test on the GeneXpert Dx and Xpert Xpress systems.   CBC/DIFF    Narrative:     The following orders were created for panel order CBC/DIFF.  Procedure                               Abnormality         Status                                     ---------                               -----------         ------                                     CBC WITH EAVW[[id number]]                                    Final result                                                 Please view results for these tests on the individual orders.   CBC WITH DIFF   EXTRA TUBES    Narrative:     The following orders were created for panel order  EXTRA TUBES.                  Procedure                               Abnormality         Status                                     ---------                               -----------         ------                                     BLUE TOP WGNF[[id number]]                                    Final result                               GOLD TOP QION[[id number]]                                    Final result  Please view results for these tests on the individual orders.   BLUE TOP TUBE   GOLD TOP TUBE   EXTRA TUBES    Narrative:     The following orders were created for panel order EXTRA TUBES.                  Procedure                               Abnormality         Status                                     ---------                               -----------         ------                                     BLUE TOP ZOXW[[id number]]                                    Final result                               GOLD TOP JXBJ[[id number]]                                    Final result                                                 Please view results for these tests on the individual orders.   BLUE TOP TUBE   GOLD TOP TUBE   PERFORM POC WHOLE BLOOD GLUCOSE       Medical Decision Making  Amount and/or Complexity of Data Reviewed  Labs: ordered. Decision-making details documented in ED Course.  Radiology: ordered. Decision-making details documented in ED Course.  ECG/medicine tests: ordered and independent interpretation performed. Decision-making details documented in ED Course.    Risk  OTC drugs.  Decision regarding hospitalization.          ER Course:  Appropriate labs and imaging ordered. Medical Records reviewed.   During the patient's stay in the emergency department, the above listed imaging and/or labs were performed to assist with medical decision making and were reviewed by myself when available for review.   ED Course as of 01/18/23 1914   Mon Jan 18, 2023    [facility] contacted and they may be able to accept.  Will check bed status and call back shortly.  Patient also already on wait list at [facility].   1454 Patient accepted to [facility] under care of Dr. Talmadge Coventry, Neurology.  Request face sheet be faxed and images be put on disc and pushed in system.  Also recommend ASA   1912 Nursing just advised a bed has become available  at [facility]      Pt remained stable throughout the emergency department course.      Impression:    Clinical Impression   Symptoms of cerebrovascular accident (CVA) (Primary)      Disposition:  Transfered to Another Facility       Follow Up Plan:  Follow-up with PCP or return to the emergency room with any worsening symptoms.    No follow-ups on file.   New Prescriptions    No medications on file        Sharlotte Alamo, DO

## 2023-01-18 NOTE — ED Nurses Note (Signed)
Patient reports heartburn after eating [employer]. Medication given.

## 2023-01-18 NOTE — ED Nurses Note (Signed)
Jan Care leaving with patient at this time.

## 2023-01-18 NOTE — ED Nurses Note (Signed)
Jan-Care here at this time.

## 2023-01-18 NOTE — ED Triage Notes (Signed)
Patients family member states "he is having left side weakness, Yesterday on Sunday I went to visit and found him in the garage floor, he had been laying there for 20 min. I got him up and noticed his left side was weaker than his right. I came back to see him today and he is shuffling when he walk and is still weak on that side."     Patient states he just feels weak. Patient had trouble standing from wheel chair and barley used his left side when getting into bed.

## 2023-04-05 ENCOUNTER — Other Ambulatory Visit: Payer: Self-pay

## 2023-04-05 ENCOUNTER — Ambulatory Visit: Payer: Commercial Managed Care - PPO | Attending: NURSE PRACTITIONER

## 2023-04-05 DIAGNOSIS — Z8673 Personal history of transient ischemic attack (TIA), and cerebral infarction without residual deficits: Secondary | ICD-10-CM | POA: Insufficient documentation

## 2023-04-05 DIAGNOSIS — J449 Chronic obstructive pulmonary disease, unspecified: Secondary | ICD-10-CM | POA: Insufficient documentation

## 2023-04-05 DIAGNOSIS — I519 Heart disease, unspecified: Secondary | ICD-10-CM | POA: Insufficient documentation

## 2023-04-05 DIAGNOSIS — E119 Type 2 diabetes mellitus without complications: Secondary | ICD-10-CM | POA: Insufficient documentation

## 2023-04-05 DIAGNOSIS — R5383 Other fatigue: Secondary | ICD-10-CM | POA: Insufficient documentation

## 2023-04-05 DIAGNOSIS — E669 Obesity, unspecified: Secondary | ICD-10-CM | POA: Insufficient documentation

## 2023-04-05 DIAGNOSIS — I10 Essential (primary) hypertension: Secondary | ICD-10-CM | POA: Insufficient documentation

## 2023-04-05 DIAGNOSIS — E785 Hyperlipidemia, unspecified: Secondary | ICD-10-CM | POA: Insufficient documentation

## 2023-04-05 DIAGNOSIS — Z79899 Other long term (current) drug therapy: Secondary | ICD-10-CM | POA: Insufficient documentation

## 2023-04-05 DIAGNOSIS — Z125 Encounter for screening for malignant neoplasm of prostate: Secondary | ICD-10-CM | POA: Insufficient documentation

## 2023-04-05 DIAGNOSIS — Z0000 Encounter for general adult medical examination without abnormal findings: Secondary | ICD-10-CM | POA: Insufficient documentation

## 2023-04-05 LAB — HGA1C (HEMOGLOBIN A1C WITH EST AVG GLUCOSE)
ESTIMATED AVERAGE GLUCOSE: 151 mg/dL
HEMOGLOBIN A1C: 6.9 % — ABNORMAL HIGH (ref <=5.7)

## 2023-04-05 LAB — PSA SCREENING: PSA: 0.47 ng/mL (ref <=4.00)

## 2023-04-05 LAB — COMPREHENSIVE METABOLIC PNL, FASTING
ALBUMIN: 3.9 g/dL (ref 3.4–4.8)
ALKALINE PHOSPHATASE: 99 U/L (ref 45–115)
ALT (SGPT): 27 U/L (ref 10–55)
ANION GAP: 9 mmol/L (ref 4–13)
AST (SGOT): 19 U/L (ref 8–45)
BILIRUBIN TOTAL: 1.2 mg/dL (ref 0.3–1.3)
BUN/CREA RATIO: 11 (ref 6–22)
BUN: 12 mg/dL (ref 8–25)
CALCIUM: 9.7 mg/dL (ref 8.6–10.3)
CHLORIDE: 107 mmol/L (ref 96–111)
CO2 TOTAL: 24 mmol/L (ref 23–31)
CREATININE: 1.05 mg/dL (ref 0.75–1.35)
ESTIMATED GFR - MALE: 75 mL/min/BSA (ref >=60)
GLUCOSE: 144 mg/dL — ABNORMAL HIGH (ref 70–99)
POTASSIUM: 4 mmol/L (ref 3.5–5.1)
PROTEIN TOTAL: 7.4 g/dL (ref 6.0–8.0)
SODIUM: 140 mmol/L (ref 136–145)

## 2023-04-05 LAB — CBC WITH DIFF
BASOPHIL #: <0.1 10*3/uL (ref <=0.20)
BASOPHIL %: 1 %
EOSINOPHIL #: 0.19 10*3/uL (ref <=0.50)
EOSINOPHIL %: 3 %
HCT: 44.2 % (ref 38.9–52.0)
HGB: 14.5 g/dL (ref 13.4–17.5)
IMMATURE GRANULOCYTE #: <0.1 10*3/uL (ref <0.10)
IMMATURE GRANULOCYTE %: 0 % (ref 0.0–1.0)
LYMPHOCYTE #: 1.42 10*3/uL (ref 1.00–4.80)
LYMPHOCYTE %: 22 %
MCH: 29.1 pg (ref 26.0–32.0)
MCHC: 32.8 g/dL (ref 31.0–35.5)
MCV: 88.6 fL (ref 78.0–100.0)
MONOCYTE #: 0.52 10*3/uL (ref 0.20–1.10)
MONOCYTE %: 8 %
MPV: 10.2 fL (ref 8.7–12.5)
NEUTROPHIL #: 4.42 10*3/uL (ref 1.50–7.70)
NEUTROPHIL %: 66 %
PLATELETS: 173 10*3/uL (ref 150–400)
RBC: 4.99 10*6/uL (ref 4.50–6.10)
RDW-CV: 15.1 % (ref 11.5–15.5)
WBC: 6.6 10*3/uL (ref 3.7–11.0)

## 2023-04-05 LAB — LIPID PANEL
CHOL/HDL RATIO: 2.6
CHOLESTEROL: 110 mg/dL (ref 100–200)
HDL CHOL: 42 mg/dL — ABNORMAL LOW (ref >=50)
LDL CALC: 49 mg/dL (ref <100)
NON-HDL: 68 mg/dL (ref <=190)
TRIGLYCERIDES: 103 mg/dL (ref <150)
VLDL CALC: 14 mg/dL (ref <30)

## 2023-04-05 LAB — THYROXINE, FREE (FREE T4): THYROXINE (T4), FREE: 1.06 ng/dL (ref 0.70–1.48)

## 2023-04-05 LAB — THYROID STIMULATING HORMONE (SENSITIVE TSH): TSH: 2.443 u[IU]/mL (ref 0.350–4.940)

## 2023-06-03 ENCOUNTER — Inpatient Hospital Stay (HOSPITAL_COMMUNITY): Payer: Commercial Managed Care - PPO

## 2023-06-03 ENCOUNTER — Other Ambulatory Visit: Payer: Self-pay

## 2023-06-03 ENCOUNTER — Emergency Department (HOSPITAL_COMMUNITY): Payer: Commercial Managed Care - PPO

## 2023-06-03 ENCOUNTER — Observation Stay (HOSPITAL_COMMUNITY): Payer: Commercial Managed Care - PPO

## 2023-06-03 ENCOUNTER — Observation Stay
Admission: EM | Admit: 2023-06-03 | Discharge: 2023-06-08 | Disposition: A | Discharge status: Home / Self Care | Payer: Commercial Managed Care - PPO | Attending: Student in an Organized Health Care Education/Training Program | Admitting: Student in an Organized Health Care Education/Training Program

## 2023-06-03 DIAGNOSIS — Z8673 Personal history of transient ischemic attack (TIA), and cerebral infarction without residual deficits: Secondary | ICD-10-CM

## 2023-06-03 DIAGNOSIS — R29810 Facial weakness: Secondary | ICD-10-CM

## 2023-06-03 DIAGNOSIS — I69392 Facial weakness following cerebral infarction: Secondary | ICD-10-CM

## 2023-06-03 DIAGNOSIS — F17290 Nicotine dependence, other tobacco product, uncomplicated: Secondary | ICD-10-CM | POA: Insufficient documentation

## 2023-06-03 DIAGNOSIS — I6381 Other cerebral infarction due to occlusion or stenosis of small artery: Secondary | ICD-10-CM

## 2023-06-03 DIAGNOSIS — K219 Gastro-esophageal reflux disease without esophagitis: Secondary | ICD-10-CM | POA: Insufficient documentation

## 2023-06-03 DIAGNOSIS — Z7951 Long term (current) use of inhaled steroids: Secondary | ICD-10-CM | POA: Insufficient documentation

## 2023-06-03 DIAGNOSIS — I6782 Cerebral ischemia: Secondary | ICD-10-CM

## 2023-06-03 DIAGNOSIS — Z794 Long term (current) use of insulin: Secondary | ICD-10-CM

## 2023-06-03 DIAGNOSIS — Z7902 Long term (current) use of antithrombotics/antiplatelets: Secondary | ICD-10-CM | POA: Insufficient documentation

## 2023-06-03 DIAGNOSIS — J449 Chronic obstructive pulmonary disease, unspecified: Secondary | ICD-10-CM | POA: Insufficient documentation

## 2023-06-03 DIAGNOSIS — R531 Weakness: Secondary | ICD-10-CM

## 2023-06-03 DIAGNOSIS — Z955 Presence of coronary angioplasty implant and graft: Secondary | ICD-10-CM | POA: Insufficient documentation

## 2023-06-03 DIAGNOSIS — R410 Disorientation, unspecified: Secondary | ICD-10-CM | POA: Insufficient documentation

## 2023-06-03 DIAGNOSIS — I10 Essential (primary) hypertension: Secondary | ICD-10-CM | POA: Insufficient documentation

## 2023-06-03 DIAGNOSIS — I639 Cerebral infarction, unspecified: Principal | ICD-10-CM | POA: Diagnosis present

## 2023-06-03 DIAGNOSIS — I6621 Occlusion and stenosis of right posterior cerebral artery: Principal | ICD-10-CM | POA: Insufficient documentation

## 2023-06-03 DIAGNOSIS — Z7982 Long term (current) use of aspirin: Secondary | ICD-10-CM | POA: Insufficient documentation

## 2023-06-03 DIAGNOSIS — E119 Type 2 diabetes mellitus without complications: Secondary | ICD-10-CM | POA: Insufficient documentation

## 2023-06-03 DIAGNOSIS — Z7984 Long term (current) use of oral hypoglycemic drugs: Secondary | ICD-10-CM | POA: Insufficient documentation

## 2023-06-03 DIAGNOSIS — I69398 Other sequelae of cerebral infarction: Secondary | ICD-10-CM | POA: Insufficient documentation

## 2023-06-03 DIAGNOSIS — Z95828 Presence of other vascular implants and grafts: Secondary | ICD-10-CM | POA: Insufficient documentation

## 2023-06-03 DIAGNOSIS — I69328 Other speech and language deficits following cerebral infarction: Secondary | ICD-10-CM | POA: Insufficient documentation

## 2023-06-03 DIAGNOSIS — I2510 Atherosclerotic heart disease of native coronary artery without angina pectoris: Secondary | ICD-10-CM | POA: Insufficient documentation

## 2023-06-03 DIAGNOSIS — I69320 Aphasia following cerebral infarction: Secondary | ICD-10-CM

## 2023-06-03 DIAGNOSIS — R079 Chest pain, unspecified: Secondary | ICD-10-CM

## 2023-06-03 DIAGNOSIS — R29898 Other symptoms and signs involving the musculoskeletal system: Secondary | ICD-10-CM

## 2023-06-03 DIAGNOSIS — I69354 Hemiplegia and hemiparesis following cerebral infarction affecting left non-dominant side: Secondary | ICD-10-CM | POA: Insufficient documentation

## 2023-06-03 LAB — COMPREHENSIVE METABOLIC PANEL, NON-FASTING
ALBUMIN: 3.9 g/dL (ref 3.5–5.2)
ALKALINE PHOSPHATASE: 91 U/L (ref 35–129)
ALT (SGPT): 23 U/L (ref 0–41)
ANION GAP: 10 mmol/L
AST (SGOT): 26 U/L (ref 0–40)
BILIRUBIN TOTAL: 0.7 mg/dL (ref 0.2–1.2)
BUN: 10 mg/dL (ref 8–23)
CALCIUM: 9.2 mg/dL (ref 8.3–10.7)
CHLORIDE: 107 mmol/L — ABNORMAL HIGH (ref 96–106)
CO2 TOTAL: 23 mmol/L (ref 22–30)
CREATININE: 1.12 mg/dL (ref 0.70–1.20)
ESTIMATED GFR: 69 mL/min/{1.73_m2} — ABNORMAL LOW (ref >90)
GLUCOSE: 97 mg/dL (ref 74–109)
POTASSIUM: 4.5 mmol/L (ref 3.2–5.0)
PROTEIN TOTAL: 7.1 g/dL (ref 6.4–8.3)
SODIUM: 140 mmol/L (ref 133–144)

## 2023-06-03 LAB — CBC WITH DIFF
BASOPHIL #: <0.1 10*3/uL (ref <=0.20)
BASOPHIL %: 0.7 %
EOSINOPHIL #: 0.19 10*3/uL (ref <=0.50)
EOSINOPHIL %: 2.6 %
HCT: 41.7 % (ref 38.9–52.0)
HGB: 13.3 g/dL — ABNORMAL LOW (ref 13.4–17.5)
IMMATURE GRANULOCYTE #: <0.1 10*3/uL (ref <0.10)
IMMATURE GRANULOCYTE %: 0.3 % (ref 0.0–1.0)
LYMPHOCYTE #: 1.4 10*3/uL (ref 1.00–4.80)
LYMPHOCYTE %: 18.8 %
MCH: 28.5 pg (ref 26.0–32.0)
MCHC: 31.9 g/dL (ref 31.0–35.5)
MCV: 89.5 fL (ref 78.0–100.0)
MONOCYTE #: 0.77 10*3/uL (ref 0.20–1.10)
MONOCYTE %: 10.3 %
MPV: 10.1 fL (ref 8.7–12.5)
NEUTROPHIL #: 5.02 10*3/uL (ref 1.50–7.70)
NEUTROPHIL %: 67.3 %
PLATELETS: 158 10*3/uL (ref 150–400)
RBC: 4.66 10*6/uL (ref 4.50–6.10)
RDW-CV: 15.8 % — ABNORMAL HIGH (ref 11.5–15.5)
WBC: 7.5 10*3/uL (ref 3.7–11.0)

## 2023-06-03 LAB — MAGNESIUM: MAGNESIUM: 2.3 mg/dL (ref 1.6–2.4)

## 2023-06-03 LAB — TROPONIN-T
TROPONIN-T: 8 ng/L (ref <=22)
TROPONIN-T: 8 ng/L (ref <=22)

## 2023-06-03 LAB — PTT (PARTIAL THROMBOPLASTIN TIME): APTT: 30.5 s (ref 24.0–35.6)

## 2023-06-03 LAB — NT-PROBNP: NT-PROBNP: 482 pg/mL — ABNORMAL HIGH (ref 0–125)

## 2023-06-03 LAB — PT/INR
INR: 1.06 (ref 0.86–1.14)
PROTHROMBIN TIME: 14 s (ref 11.4–14.2)

## 2023-06-03 MED ORDER — ENOXAPARIN 40 MG/0.4 ML SUBCUTANEOUS SYRINGE
40.0000 mg | INJECTION | SUBCUTANEOUS | Status: DC
Start: 2023-06-04 — End: 2023-06-08
  Administered 2023-06-04 – 2023-06-08 (×5): 40 mg via SUBCUTANEOUS
  Filled 2023-06-03 (×5): qty 0.4

## 2023-06-03 MED ORDER — QUETIAPINE 25 MG TABLET
25.0000 mg | ORAL_TABLET | Freq: Once | ORAL | Status: AC
Start: 2023-06-04 — End: 2023-06-03
  Administered 2023-06-03: 25 mg via ORAL
  Filled 2023-06-03 (×2): qty 1

## 2023-06-03 MED ORDER — FINASTERIDE 5 MG TABLET
5.0000 mg | ORAL_TABLET | Freq: Every morning | ORAL | Status: DC
Start: 2023-06-04 — End: 2023-06-08
  Administered 2023-06-04 – 2023-06-08 (×5): 5 mg via ORAL
  Filled 2023-06-03 (×6): qty 1

## 2023-06-03 MED ORDER — ASPIRIN 81 MG CHEWABLE TABLET
81.0000 mg | CHEWABLE_TABLET | Freq: Every day | ORAL | Status: DC
Start: 2023-06-03 — End: 2023-06-08
  Administered 2023-06-03: 0 mg via ORAL
  Administered 2023-06-04 – 2023-06-08 (×5): 81 mg via ORAL
  Filled 2023-06-03 (×5): qty 1

## 2023-06-03 MED ORDER — PANTOPRAZOLE 40 MG TABLET,DELAYED RELEASE
40.0000 mg | DELAYED_RELEASE_TABLET | Freq: Every day | ORAL | Status: DC
Start: 2023-06-03 — End: 2023-06-08
  Administered 2023-06-03: 0 mg via ORAL
  Administered 2023-06-04 – 2023-06-08 (×5): 40 mg via ORAL
  Filled 2023-06-03 (×6): qty 1

## 2023-06-03 MED ORDER — CLOPIDOGREL 75 MG TABLET
75.0000 mg | ORAL_TABLET | Freq: Every day | ORAL | Status: DC
Start: 2023-06-03 — End: 2023-06-08
  Administered 2023-06-03: 0 mg via ORAL
  Administered 2023-06-04 – 2023-06-08 (×5): 75 mg via ORAL
  Filled 2023-06-03 (×5): qty 1

## 2023-06-03 MED ORDER — TAMSULOSIN 0.4 MG CAPSULE
0.8000 mg | ORAL_CAPSULE | Freq: Every day | ORAL | Status: DC
Start: 2023-06-03 — End: 2023-06-08
  Administered 2023-06-03: 0 mg via ORAL
  Administered 2023-06-04 – 2023-06-08 (×5): 0.8 mg via ORAL
  Filled 2023-06-03 (×6): qty 2

## 2023-06-03 MED ORDER — IOPAMIDOL 370 MG IODINE/ML (76 %) INTRAVENOUS SOLUTION
100.0000 mL | INTRAVENOUS | Status: AC
Start: 2023-06-03 — End: 2023-06-03
  Administered 2023-06-03: 100 mL via INTRAVENOUS

## 2023-06-03 MED ORDER — ATORVASTATIN 40 MG TABLET
80.0000 mg | ORAL_TABLET | Freq: Every evening | ORAL | Status: DC
Start: 2023-06-03 — End: 2023-06-08
  Administered 2023-06-03: 0 mg via ORAL
  Administered 2023-06-04 – 2023-06-07 (×4): 80 mg via ORAL
  Filled 2023-06-03 (×6): qty 2

## 2023-06-03 NOTE — ED Nurses Note (Signed)
Speech in with pt for swallow evaluation

## 2023-06-03 NOTE — Consults (Signed)
[facility]   Neurology Consult    Derek Knox, Derek Knox, 72 y.o. male  Date of Admission:  06/03/2023  Date of Birth:  [birth year]    PCP: Jen Mow, DO    Information obtained from: patient and daughter  Chief Complaint:  "Right carotid stent on Monday, worsening stroke symptoms after"      HPI  Derek Knox is a 71 y.o., [place] male who according to daughter at bedside experienced an ischemic stroke in May of 2024.  At the same time he was diagnosed with severe carotid artery stenosis requiring intervention which patient had on Monday 05/31/2023.  Following this procedure the patient reportedly experienced slurring of speech and worsening left-sided weakness.  According to the daughter patient was discharged home without further workup and symptoms were attributed to anesthesia.    At home however symptoms did not improve so patient was brought in for further evaluation.    He has a history of previous tobacco use disorder complicated by chronic obstructive pulmonary disease, diabetes mellitus, gastroesophageal reflux disease, history of recent stroke and back pain.      Past Medical History:   Diagnosis Date    COPD (chronic obstructive pulmonary disease) (CMS HCC)     Diabetes mellitus, type 2 (CMS HCC)     GERD (gastroesophageal reflux disease)          Past Surgical History:   Procedure Laterality Date    HX BACK SURGERY      HX HIP REPLACEMENT Right          Medications Prior to Admission       Prescriptions    aspirin 325 mg Oral Tablet    Take 1 Tablet (325 mg total) by mouth Once a day    budesonide-glycopyr-formoterol (BREZTRI AEROSPHERE) 160-9-4.8 mcg/actuation Inhalation HFA Aerosol Inhaler    Take 2 Puffs by inhalation Twice daily    clobetasoL (TEMOVATE) 0.05 % Cream    Apply topically Twice daily    donepeziL (ARICEPT) 10 mg Oral Tablet    Take 1 Tablet (10 mg total) by mouth Every night    dutasteride (AVODART) 0.5 mg Oral Capsule    Take 1 Capsule (0.5 mg total) by mouth Once a  day    glimepiride (AMARYL) 4 mg Oral Tablet    Take 1 Tablet (4 mg total) by mouth Every morning with breakfast    pantoprazole (PROTONIX) 40 mg Oral Tablet, Delayed Release (E.C.)    Take 1 Tablet (40 mg total) by mouth Once a day    pioglitazone (ACTOS) 45 mg Oral Tablet    Take 1 Tablet (45 mg total) by mouth Once a day    tamsulosin (FLOMAX) 0.4 mg Oral Capsule    Take 2 Capsules (0.8 mg total) by mouth Once a day    tiotropium bromide (SPIRIVA RESPIMAT) 2.5 mcg/actuation Inhalation oral inhaler    Take 2 Inhalations (2 Puffs total) by inhalation Once a day          Allergies   Allergen Reactions    Sulfa (Sulfonamides) Hives/ Urticaria     Social History     Tobacco Use    Smoking status: Every Day     Types: Cigars    Smokeless tobacco: Never   Substance Use Topics    Alcohol use: Not on file     Past Family History  Noncontributory    ROS  - other than ROS in the HPI, all other systems were  negative    OBJECTIVE  Exam:  Temperature: 36.5 C (97.7 F)  Heart Rate: 60  BP (Non-Invasive): 123/79  Respiratory Rate: 20  SpO2: 99 %    GENERAL:  72 year old Caucasian male, reclined in bed, not  in acute respiratory distress    HEAD:  Atraumatic.   NECK: Full range of motion and supple, no midline tenderness.   CARDIOVASCULAR: Regular rate and rhythm   NEUROLOGIC EXAMINATION:  Mental Status Examination:  Awake, alert, coherent, completely oriented, fluent, can repeat, can follow simple and complex commands     Cranial Nerves:  Cranial Nerve II: PERRL, Fields were full to confrontation   Cranial Nerves III, IV, VI: EOM intact, no nystagmus, no ptosis    Cranial Nerve V:  Not   Cranial Nerve VII:  Mild left facial droop   Cranial Nerve VIII: Seems to hear well    Cranial Nerves IX, X:  Slight dysarthria   Cranial Nerve XI: Able to turn head to either side    Cranial Nerve XII: Normal movements of tongue, no fasciculation, tongue midline      Motor Exams   Overall motor examination showed normal muscle bulk, tone, and  strength of 5/5 throughout except for mild left-sided weakness.  No adventitious movements noted.   Clonus absent     Babinski; deferred  DTRs +2 overall     Sensory:   Light touch intact in all extremities    Coordination:   Deferred  Gait and Station:   Deferred        RESULTS:  Review of reports and notes reveal:   Emergency room report, EMS, and current hospitalization documentation were independently reviewed.    Labs:  I have reviewed all lab results.  Lab Results Today:    Results for orders placed or performed during the hospital encounter of 06/03/23 (from the past 24 hour(s))   BNP   Result Value Ref Range    NT-PROBNP 482 (H) 0 - 125 pg/mL   COMPREHENSIVE METABOLIC PANEL, NON-FASTING   Result Value Ref Range    SODIUM 140 133 - 144 mmol/L    POTASSIUM 4.5 3.2 - 5.0 mmol/L    CHLORIDE 107 (H) 96 - 106 mmol/L    CO2 TOTAL 23 22 - 30 mmol/L    ANION GAP 10 mmol/L    BUN 10 8 - 23 mg/dL    CREATININE 4.01 0.27 - 1.20 mg/dL    ESTIMATED GFR 69 (L) >90 mL/min/1.58m^2    ALBUMIN 3.9 3.5 - 5.2 g/dL    CALCIUM 9.2 8.3 - 25.3 mg/dL    GLUCOSE 97 74 - 664 mg/dL    ALKALINE PHOSPHATASE 91 35 - 129 U/L    ALT (SGPT) 23 0 - 41 U/L    AST (SGOT) 26 0 - 40 U/L    BILIRUBIN TOTAL 0.7 0.2 - 1.2 mg/dL    PROTEIN TOTAL 7.1 6.4 - 8.3 g/dL   TROPONIN-T NOW   Result Value Ref Range    TROPONIN-T 8 <=22 ng/L   MAGNESIUM   Result Value Ref Range    MAGNESIUM 2.3 1.6 - 2.4 mg/dL   PT/INR   Result Value Ref Range    PROTHROMBIN TIME 14.0 11.4 - 14.2 seconds    INR 1.06 0.86 - 1.14   PTT (PARTIAL THROMBOPLASTIN TIME)   Result Value Ref Range    APTT 30.5 24.0 - 35.6 seconds   CBC WITH DIFF   Result Value Ref Range  WBC 7.5 3.7 - 11.0 x10^3/uL    RBC 4.66 4.50 - 6.10 x10^6/uL    HGB 13.3 (L) 13.4 - 17.5 g/dL    HCT 51.7 61.6 - 07.3 %    MCV 89.5 78.0 - 100.0 fL    MCH 28.5 26.0 - 32.0 pg    MCHC 31.9 31.0 - 35.5 g/dL    RDW-CV 71.0 (H) 62.6 - 15.5 %    PLATELETS 158 150 - 400 x10^3/uL    MPV 10.1 8.7 - 12.5 fL    NEUTROPHIL % 67.3  %    LYMPHOCYTE % 18.8 %    MONOCYTE % 10.3 %    EOSINOPHIL % 2.6 %    BASOPHIL % 0.7 %    NEUTROPHIL # 5.02 1.50 - 7.70 x10^3/uL    LYMPHOCYTE # 1.40 1.00 - 4.80 x10^3/uL    MONOCYTE # 0.77 0.20 - 1.10 x10^3/uL    EOSINOPHIL # 0.19 <=0.50 x10^3/uL    BASOPHIL # <0.10 <=0.20 x10^3/uL    IMMATURE GRANULOCYTE % 0.3 0.0 - 1.0 %    IMMATURE GRANULOCYTE # <0.10 <0.10 x10^3/uL   ECG 12 LEAD   Result Value Ref Range    Ventricular rate 54 BPM    Atrial Rate 54 BPM    PR Interval 182 ms    QRS Duration 78 ms    QT Interval 454 ms    QTC Calculation 430 ms    Calculated P Axis 63 degrees    Calculated R Axis 42 degrees    Calculated T Axis 68 degrees   TROPONIN-T IN TWO HOURS   Result Value Ref Range    TROPONIN-T 8 <=22 ng/L     BMP:  BMP (Last 24 Hours):    Recent Results last 24 hours     06/03/23  1132   SODIUM 140   POTASSIUM 4.5   CHLORIDE 107*   CO2 23   BUN 10   CREATININE 1.12   CALCIUM 9.2   GLUCOSENF 97     CBC Results Differential Results   Recent Labs     06/03/23  1132   WBC 7.5   HGB 13.3*   HCT 41.7   PLTCNT 158    Recent Results (from the past 30 hour(s))   CBC WITH DIFF    Collection Time: 06/03/23 11:32 AM   Result Value    WBC 7.5    NEUTROPHIL % 67.3    MONOCYTE % 10.3    BASOPHIL % 0.7    BASOPHIL # <0.10        Abs Count:     Recent Labs     06/03/23  1132   PMNABS 5.02   LYMPHSABS 1.40   EOSABS 0.19   MONOSABS 0.77   BASOPHILS 0.7  <0.10     Sodium:    Recent Labs     06/03/23  1132   SODIUM 140     Potassium:     Recent Labs     06/03/23  1132   POTASSIUM 4.5     Hepatic Function:    Recent Labs     06/03/23  1132   TOTALPROTEIN 7.1   ALBUMIN 3.9   TOTBILIRUBIN 0.7   AST 26   ALT 23   ALKPHOS 91     Albumin:    Recent Labs     06/03/23  1132   ALBUMIN 3.9     Calcium:     Recent Labs  06/03/23  1132   CALCIUM 9.2     Ionized Calcium:   No results found for this encounter  Magnesium:     Recent Labs     06/03/23  1132   MAGNESIUM 2.3     Phosphorus:   No results found for this encounter  LDH:  No  results found for this encounter  ESR(automated)/C-protein: No results found for this encounter  ESR: No results found for this encounter  Coags:    Recent Labs     06/03/23  1132   PROTHROMTME 14.0   INR 1.06   APTT 30.5     PT:    Recent Labs     06/03/23  1132   PROTHROMTME 14.0     INR:     Recent Labs     06/03/23  1132   INR 1.06     PTT:     Recent Labs     06/03/23  1132   APTT 30.5     Independent Interpretation of images or specimens:  MRI of the brain was independently reviewed and shows a significant burden of subcortical Nevares matter disease, lacunar strokes and generalized cerebral atrophy out of proportion to patient's age.        ASSESSMENT & PLAN/RECOMMENDATIONS  The patient is a pleasant 72 year old male who was admitted to the hospital due to concerns for worsening stroke symptoms following carotid surgery 2 days prior to this admission.  The patient has a history of a stroke in May of 2024, severe carotid artery stenosis status post surgery, hypertension, previous tobacco use disorder and alcohol use disorder.      Impression:  Active Hospital Problems    Diagnosis    Primary Problem: CVA (cerebral vascular accident) (CMS HCC)    Internal carotid artery stent present   MRI of the brain was completed and did not show any acute ischemic changes on DWI sequence.  Given known recent history of stroke, symptoms are consistent with a recrudescence of the old stroke.  Patient's symptoms were likely brought on by severe hypotension following surgery.    Recommendations:   -Ensure adequate cerebral perfusion by avoiding extremes of blood pressures especially hypotension in this patient  -Continue modification of all stroke risk factors; hypertension, hyperlipidemia, diabetes mellitus (A1c of 6.9 a month ago)  -Ensure stricter control blood glucose levels in the outpatient setting  -Continue neuro checks q.4  -Consult physical and occupational therapy to evaluate, treat and advise  -Patient might benefit  from some rehabilitation post discharge  -Continue dual antiplatelet therapy with aspirin and Plavix  -Call Neurology should patient experience any sudden neuro-deficits  -Refer patient to Dr. Liam Rogers for follow-up in the outpatient setting       Thank you for the consult  Derek A. Marguerita Beards, MD  Neurology          Derek Mcmurphy Clent Ridges, MD  06/03/2023, 19:39

## 2023-06-03 NOTE — Care Plan (Signed)
[facility]   [address]  [place], [address]  (Office) [phone]-    Rehabilitation Services    Clinical Swallowing Assessment          Patient Name: Derek Knox  Date of Birth: [birth year]  Weight:  Weight: 72.6 kg (160 lb)  Room/Bed: ED08/ED08  Payor: [employer] MEDICARE / Plan: [employer] CHOICE PPO / Product Type: PPO /        06/03/23 1345   Rehab Session   SLP Visit Date 06/03/23   Total SLP Minutes: 25   General Information   Patient Profile Reviewed yes   Referring Physician Conchita Paris, MD   Mutuality/Individual Preferences   Anxieties, Fears or Concerns none voiced   Patient-Specific Goals (Include Timeframe) swallow free from aspiration   Cognitive   Level of Consciousness alert   SLP Clinical Impression   Assessment observations suggest a functional oropharyngeal phase of the swallow     Subjective:  Pt is a 72 y/o male who presented to the ED with stroke-like symptoms. Per chart review, pt underwent right carotid stent placement at [facility] on Monday. Pt is now being worked up for stroke. PMH includes COPD, diabetes type 2, and GERD. Pt endorses having a recent stroke in May 2024. Pt endorses no history of dysphagia or speech/language/cognition difficulties since then. CT of the brain revealed chronic appearing right lacunar infarct which has evolved and atrophy and chronic microvascular ischemic disease as before. MRI ordered. Chest x-ray revealed no acute cardiopulmonary process. Pt is currently NPO. Pt is alert and agreeable to this evaluation.      Objective:   Pt. Positioned upright and given thin, pudding and solid consistencies.  Thin given via spoon, cup and straw.      Assessment:    Oral Motor:  Adequate lip and tongue ROM and strength.     Oral Phase:  Pt. displayed adequate lip closure.  Pt. With adequate bolus formulation with no oral residue noted.  Observations suggest a functional oral phase of the swallow.     Pharyngeal  Phase:  No cough, choke or wet vocal quality noted with consistencies given suggesting a functional swallow.  Silent aspiration cannot be ruled out at bedside.      Speech/Language/Cognition screen: Pt alert and oriented x4. Speech is clear and intelligible in conversation. All language structures are intact in conversation. Speech therapy will continue to monitor speech/language/cognition.     Impressions:  Clinical observations suggest a functional oro-pharyngeal phase swallow.     Plan/Recommendations:   Consider a regular diet with thin liquids.   Full upright position with all oral intake and remain upright for 30 minutes.   Monitor pt. For signs and symptoms of aspiration.   Clinical correlation is recommended.    Speech therapy will follow to check for tolerance of diet and monitor speech/language/cognition.     Therapy Frequency: 2-5 days per week    Rehab Potential:  Rehab potential is good with speech intervention and patient participation.      POST ACUTE DISCHARGE RECOMMENDATION   Based on current diagnosis, functional performance prior to admission, and current functional performance, this patient does require continued SLP services in order to achieve significant functional improvements for Swallowing     Speech Intervention Minutes: SWALLOW EVALUATION 25 MINUTES    Thank you for inviting me to participate with you in the care of your patient.        Therapist:  Vonzella Nipple Florene Brill, SLP  06/03/2023  15:17

## 2023-06-03 NOTE — Consults (Signed)
[facility]   Consult  Note  Derek Knox, Derek Knox, 72 y.o. male  Date of Birth:  May 28, 1951  Encounter Start Date:  06/03/2023  Inpatient Admission Date: 06/03/2023  Date of service: 06/03/2023    Service: Vascular Surgery   Requesting MD: Dr. Eulah Pont     Reason for consultation: s/p Right carotid stent    Subjective:     HPI: Derek Knox is a 72 y.o. [place] male who presents for evaluation of possible possible stroke.  Patient recently underwent right carotid artery stent placement and [facility] on Monday.  Patient had a stroke in May of 2024 with left sided deficits that had mostly improved; symptoms worsened after his procedure.  His daughter states that his surgery was on Monday.  On Tuesday night he apparently became confused and on Wednesday morning she noted he had new facial drooping and speech issues however was discharged. States the weakness on the left was more pronounced again. He had hypotension during his hospitalization and required Levophed.  His atenolol was discontinued at discharge. Patient was brought it today for evaluation.  During evaluation most symptoms have improved.  He has some left sided weakness.     CTA of the head and neck - shows a patent right internal carotid stent, no significant carotid or vertebral artery stenosis.     PMH:   Past Medical History:   Diagnosis Date    COPD (chronic obstructive pulmonary disease) (CMS HCC)     Diabetes mellitus, type 2 (CMS HCC)     GERD (gastroesophageal reflux disease)      Past Surgical Hx:  Past Surgical History:   Procedure Laterality Date    HX BACK SURGERY      HX HIP REPLACEMENT Right      Family Hx:  Family Medical History:    None       Medications:  Current Facility-Administered Medications   Medication Dose Route Frequency Provider Last Rate Last Admin    [START ON 06/04/2023] enoxaparin PF (LOVENOX) 40 mg/0.4 mL SubQ injection  40 mg Subcutaneous Q24H Conchita Paris, MD         Current  Outpatient Medications   Medication Sig Dispense Refill    aspirin 325 mg Oral Tablet Take 1 Tablet (325 mg total) by mouth Once a day      budesonide-glycopyr-formoterol (BREZTRI AEROSPHERE) 160-9-4.8 mcg/actuation Inhalation HFA Aerosol Inhaler Take 2 Puffs by inhalation Twice daily      clobetasoL (TEMOVATE) 0.05 % Cream Apply topically Twice daily      donepeziL (ARICEPT) 10 mg Oral Tablet Take 1 Tablet (10 mg total) by mouth Every night      dutasteride (AVODART) 0.5 mg Oral Capsule Take 1 Capsule (0.5 mg total) by mouth Once a day      glimepiride (AMARYL) 4 mg Oral Tablet Take 1 Tablet (4 mg total) by mouth Every morning with breakfast      metFORMIN (GLUCOPHAGE) 850 mg Oral Tablet Take 1 Tablet (850 mg total) by mouth Twice daily with food      pantoprazole (PROTONIX) 40 mg Oral Tablet, Delayed Release (E.C.) Take 1 Tablet (40 mg total) by mouth Once a day      pioglitazone (ACTOS) 45 mg Oral Tablet Take 1 Tablet (45 mg total) by mouth Once a day      tamsulosin (FLOMAX) 0.4 mg Oral Capsule Take 2 Capsules (0.8 mg total) by mouth Once a day      tiotropium  bromide (SPIRIVA RESPIMAT) 2.5 mcg/actuation Inhalation oral inhaler Take 2 Inhalations (2 Puffs total) by inhalation Once a day       Allergies:  Allergies   Allergen Reactions    Sulfa (Sulfonamides) Hives/ Urticaria     Review Of Systems:  Pertinent positives and negatives are in HPI    Objective:  Physical Exam:   Vitals: BP 111/73   Pulse 58   Temp 36.5 C (97.7 F)   Resp 15   Ht 1.75 m (5' 8.9")   Wt 72.6 kg (160 lb)   SpO2 (!) 86%   BMI 23.70 kg/m     Constitutional: AA&O X3 no acute distress   HENT: Head is normocephalic, atraumatic   Neck: Normal ROM  Respiratory: Effort normal  Extremities: L sided weakness  Integumentary: warm, pink     Plan:  Assessment/Plan   1. Cerebrovascular accident (CVA), unspecified mechanism (CMS HCC)    2. Internal carotid artery stent present       Active Hospital Problems    Diagnosis    Primary Problem: CVA  (cerebral vascular accident) (CMS HCC)      -CTA Neck reveals a patent right ICA stent  -No need for further vascular intervention at this time  -Recommend to continue medical management of ASA, Plavix, high intensity statin  -Recommend good BP control    -Will arrange one month follow up   Patient was given the opportunity to ask questions and those questions were answered to their satisfaction  Patient was seen and evaluated with Dr. [place]     Megan Arthur, APRN  06/03/2023, 15:11        Shankar Silber [place], MD Addendum:    Patient was seen and evaluated with Myrtis Ser, APRN on 06/03/23. The note was reviewed and I agree with the above documentation. Any necessary changes were made.    72 year old male who presents with stroke symptoms. He had a stroke in May and was transferred from [place] to [place] for MRI. He just had R carotid stenting performed on 9/30 by Dr. Azalia Bilis at [facility] for his symptomatic R ICA stenosis (stroke in May). Patient and daughter say it just took that long to get the procedure scheduled. Following the procedure he had episode of hypotension and CTA abdomen/pelvis was performed to check for RP hematoma. This was negative. His atenolol was stopped due to hypotension and bradycardia. He was on levo for a short period. He was also acutely confused and combative requiring Zyprexa. His donepezil was stopped as well. He did not hold his jardiance prior to the procedure. His daughter says that since his stroke in May he was on a trial medication that could have been a placebo and aspirin. About a week prior to the carotid stent this trial medication was discontinued and he was started on Plavix. He has not missed any doses. He was discharged from [facility] yesterday despite his daughter being concerned about his confusion and a new facial droop and slurred speech. He has no facial droop, slurred speech, or new neuro deficits at this time. His L hand and leg are slightly weaker compared to the  R. He is having no dysphagia or dysphonia. R femoral access site is without hematoma. CTA neck reviewed. R ICA stent is widely patent. CT head shows old infarcts. MRI is pending. No vascular intervention is indicated at this time. Continue aspirin and Plavix. Continue BP management/control. Restart atenolol when able. Discussed with the patient and his daughter  recommended guidelines of surveillance for carotid stenosis. Will plan to follow up in 1 month to assess symptoms. Will obtain b/l carotid duplex in 6 months.     Tracina Beaumont [place], MD

## 2023-06-03 NOTE — ED Nurses Note (Signed)
PT completed eval with patient.

## 2023-06-03 NOTE — ED Nurses Note (Signed)
Report given to Thayer Ohm, [place]

## 2023-06-03 NOTE — ED Provider Notes (Signed)
Name: Abigail Miyamoto  Age and Gender: 72 y.o. male  Attending: Donnamarie Rossetti      Chief Complaint   Patient presents with    Weakness       HPI:  ABE REINE is a 72 y.o. male  who presents to the Emergency Department today for possible stroke.  Patient comes in by family for concern for possible stroke.  Family reports patient underwent right carotid stent placement at [facility] on Monday.  They state since then patient has had worsening left-sided deficits.  Patient has had a prior stroke that affected the left side but after his surgery he had worsening deficits.  Family also reports patient has been very confused this morning.      History provided by:  Patient     Review of Systems:  All other systems reviewed and are negative, unless commented on in the HPI.       Objective:    Filed Vitals:    06/03/23 1105 06/03/23 1130 06/03/23 1300   BP: 96/79 123/65 (!) 114/102   Pulse: 64 60 53   Resp: 16 17 14    Temp: 36.5 C (97.7 F)     SpO2: 99% 100% 99%         Physical Exam  Nursing note and vitals reviewed.  Vital signs reviewed as above.     Estimated body mass index is 8.89 kg/m as calculated from the following:    Height as of this encounter: 1.75 m (5' 8.9").    Weight as of this encounter: 27.2 kg (60 lb).    Constitutional:  Chronically ill-appearing  HEENT: Normocephalic and atraumatic.   Eyes: Conjunctivae are normal. Pupils are equal, round, and reactive to light. EOM are intact  Neck: Soft, supple, full range of motion.  Cardiovascular: RRR.  No Murmurs/rubs/gallops. Distal pulses present and equal bilaterally.  Pulmonary/Chest: Normal BS BL with no distress. No audible wheezes or crackles are noted.  GI/Abdominal: Soft, nontender, nondistended.  No rebound, guarding, or masses.   Musculoskeletal/Extremities: Normal range of motion. No deformities.    Neurological:  GCS 15, oriented to person, place, and time.  No gaze palsy or visual field deficits.  Left-sided facial droop.   Left upper extremity drift.  Left lower extremity drift that hit the bed.  No motor deficits to the right side.  No sensory deficits.  No ataxia.  No aphasia or dysarthria.  No inattention.  Skin: Warm and dry. No rash or lesions  Psychiatric: Patient has a normal mood and affect.     Work-up:  Orders Placed This Encounter    XR AP MOBILE CHEST    CT BRAIN WO IV CONTRAST    CT ANGIO INTRA-EXTRA CRANIAL W/WO CONTRAST    BNP    CBC/DIFF    COMPREHENSIVE METABOLIC PANEL, NON-FASTING    TROPONIN-T NOW    TROPONIN-T IN TWO HOURS    MAGNESIUM    PT/INR    PTT (PARTIAL THROMBOPLASTIN TIME)    CBC WITH DIFF    OXYGEN - NASAL CANNULA    ECG 12 LEAD    INSERT & MAINTAIN PERIPHERAL IV ACCESS    PATIENT CLASS/LEVEL OF CARE DESIGNATION - [facility]/STF    iopamidol (ISOVUE-370) 76% infusion        Labs:  Results for orders placed or performed during the hospital encounter of 06/03/23 (from the past 24 hour(s))   BNP   Result Value Ref Range    NT-PROBNP 482 (  H) 0 - 125 pg/mL   CBC/DIFF    Narrative    The following orders were created for panel order CBC/DIFF.  Procedure                               Abnormality         Status                     ---------                               -----------         ------                     CBC WITH EXBM[[id number]]                Abnormal            Final result                 Please view results for these tests on the individual orders.   COMPREHENSIVE METABOLIC PANEL, NON-FASTING   Result Value Ref Range    SODIUM 140 133 - 144 mmol/L    POTASSIUM 4.5 3.2 - 5.0 mmol/L    CHLORIDE 107 (H) 96 - 106 mmol/L    CO2 TOTAL 23 22 - 30 mmol/L    ANION GAP 10 mmol/L    BUN 10 8 - 23 mg/dL    CREATININE 0.27 2.53 - 1.20 mg/dL    ESTIMATED GFR 69 (L) >90 mL/min/1.66m^2    ALBUMIN 3.9 3.5 - 5.2 g/dL    CALCIUM 9.2 8.3 - 66.4 mg/dL    GLUCOSE 97 74 - 403 mg/dL    ALKALINE PHOSPHATASE 91 35 - 129 U/L    ALT (SGPT) 23 0 - 41 U/L    AST (SGOT) 26 0 - 40 U/L    BILIRUBIN TOTAL 0.7 0.2 - 1.2 mg/dL    PROTEIN  TOTAL 7.1 6.4 - 8.3 g/dL   TROPONIN-T NOW   Result Value Ref Range    TROPONIN-T 8 <=22 ng/L    Narrative    In order to distinguish acute elevations of high sensitive Troponin from other clinical conditions, the Universal Definition of myocardial infarction stresses clinical assessment and the need for serial measurements to observe a rise and/or fall above the upper limit of the reference interval.    MAGNESIUM   Result Value Ref Range    MAGNESIUM 2.3 1.6 - 2.4 mg/dL   PT/INR   Result Value Ref Range    PROTHROMBIN TIME 14.0 11.4 - 14.2 seconds    INR 1.06 0.86 - 1.14   PTT (PARTIAL THROMBOPLASTIN TIME)   Result Value Ref Range    APTT 30.5 24.0 - 35.6 seconds    Narrative    +++++ New Range Effective 01/21/21 +++++    ** HEPARIN THERAPEUTIC RANGE: 62.4 - 94.8 SECONDS **  Please refer to the Heparin Protocol for Dosing    **CRITICAL VALUE IS >150.0 SECONDS**   CBC WITH DIFF   Result Value Ref Range    WBC 7.5 3.7 - 11.0 x10^3/uL    RBC 4.66 4.50 - 6.10 x10^6/uL    HGB 13.3 (L) 13.4 - 17.5 g/dL    HCT 47.4 25.9 - 56.3 %    MCV 89.5 78.0 - 100.0 fL  MCH 28.5 26.0 - 32.0 pg    MCHC 31.9 31.0 - 35.5 g/dL    RDW-CV 13.0 (H) 86.5 - 15.5 %    PLATELETS 158 150 - 400 x10^3/uL    MPV 10.1 8.7 - 12.5 fL    NEUTROPHIL % 67.3 %    LYMPHOCYTE % 18.8 %    MONOCYTE % 10.3 %    EOSINOPHIL % 2.6 %    BASOPHIL % 0.7 %    NEUTROPHIL # 5.02 1.50 - 7.70 x10^3/uL    LYMPHOCYTE # 1.40 1.00 - 4.80 x10^3/uL    MONOCYTE # 0.77 0.20 - 1.10 x10^3/uL    EOSINOPHIL # 0.19 <=0.50 x10^3/uL    BASOPHIL # <0.10 <=0.20 x10^3/uL    IMMATURE GRANULOCYTE % 0.3 0.0 - 1.0 %    IMMATURE GRANULOCYTE # <0.10 <0.10 x10^3/uL       Imaging:     Results for orders placed or performed during the hospital encounter of 06/03/23 (from the past 72 hour(s))   CT BRAIN WO IV CONTRAST     Status: None    Narrative    Trevel E Klaiber    CT BRAIN WO IV CONTRAST performed on 06/03/2023 11:56 AM.    INDICATION:  CVA, L sided deficits since R CEA on Monday   Additional  History:  left side weakness, CVA    TECHNIQUE:  Unenhanced CT head with axial, coronal, and sagittal multiplanar reformations. Dose modulation, automated exposure control, and/or iterative reconstruction were used for dose reduction.    COMPARISON:  Jan 26, 2023  ___________________________________  FINDINGS:    Interval evolution of a lacunar infarct in the right caudate nucleus which now has a chronic appearance. There is a stable lacunar infarct in the left caudate head as well. No hemorrhage, mass or midline shift. There is pronounced atrophy and chronic microvascular ischemic disease. Unremarkable bony calvarium.    ___________________________________    Impression    Chronic appearing right lacunar infarct which has evolved.  Atrophy and chronic microvascular ischemic disease as before.      Radiologist location ID: [id number]     XR AP MOBILE CHEST     Status: None    Narrative    Serenity E Mexicano    XR AP MOBILE CHEST performed on 06/03/2023 12:08 PM.    INDICATION:  Chest Pain   Additional History:  CP, weakness    TECHNIQUE:  Single portable frontal view of the chest.  1 views/1 images submitted for interpretation.    COMPARISON:  None available.  ___________________________________  FINDINGS:    No pneumothorax, pleural effusion, focal or diffuse infiltrate is seen.      Heart size is normal. Pulmonary vascularity is within normal limits.           Impression    No acute cardiopulmonary process.            Radiologist location ID: [id number]     CT ANGIO INTRA-EXTRA CRANIAL W/WO CONTRAST     Status: None    Narrative    Friedrich E Knobloch    CT ANGIO INTRA-EXTRA CRANIAL W/WO CONTRAST performed on 06/03/2023 12:41 PM.    INDICATION:  Stroke   Additional History:  left side weakness, cva,    TECHNIQUE:  CT angiography of the head and neck with multiplanar, MIP, and 3D reformations.  All measurements of cervical carotid stenosis are performed according to NASCET criteria. Dose modulation, automated exposure  control, and/or iterative reconstruction were used for dose  reduction.    CONTRAST:  100 mL of Isovue 370 IV    COMPARISON:  None available.  ___________________________________  FINDINGS:    INTRACRANIAL CTA:  ANTERIOR CIRCULATION:  The distal cervical, petrous, cavernous, and supraclinoid portions of the internal carotid arteries are patent without any significant stenosis.    There is a small A1 segment of the right ACA. This may be developmental. Right ACA is unremarkable. Bilateral M1 and M2 segments are unremarkable. There appears to be overall decreased small vessel opacification in the right frontal lobe when compared to left side. Findings raise possibility of distal MCA small branch occlusion.    POSTERIOR CIRCULATION:  There is stenosis at the origin of the P1 segment of the right PCA. There is another focal stenosis in the right PCA between the first and second segments. No evidence of vessel occlusion however. Left is unremarkable. The left vertebral artery is dominant. The basilar artery is patent.      EXTRACRANIAL CTA:  AORTIC ARCH:  There is a normal branching anatomy of the aortic arch.    CAROTID ARTERIES:    Right: Innominate and CCA mild to moderate mixed plaque. Calcified carotid bulb. ICA stent, patent. Distal ICA minimal disease. No significant right carotid stenosis.    Left: Moderate carotid bulb and proximal ICA hard plaque. No significant carotid stenosis.    VERTEBRAL ARTERIES:  Patent bilateral vertebral arteries. Dominant left side.    ___________________________________    Impression    1. Patent right internal carotid artery stent.    2. No significant carotid or vertebral artery stenosis    3. Overall decrease small vessel visualization of the right frontal lobe compared to the left. Findings suggestive of small distal branch occlusion/occlusions.    4. Right posterior cerebral artery stenosis.      Radiologist location ID: [id number]         Abnormal Lab results:  Labs  Ordered/Reviewed   NT-PROBNP - Abnormal; Notable for the following components:       Result Value    NT-PROBNP 482 (*)     All other components within normal limits   COMPREHENSIVE METABOLIC PANEL, NON-FASTING - Abnormal; Notable for the following components:    CHLORIDE 107 (*)     ESTIMATED GFR 69 (*)     All other components within normal limits   CBC WITH DIFF - Abnormal; Notable for the following components:    HGB 13.3 (*)     RDW-CV 15.8 (*)     All other components within normal limits   TROPONIN-T - Normal    Narrative:     In order to distinguish acute elevations of high sensitive Troponin from other clinical conditions, the Universal Definition of myocardial infarction stresses clinical assessment and the need for serial measurements to observe a rise and/or fall above the upper limit of the reference interval.    MAGNESIUM - Normal   PT/INR - Normal   PTT (PARTIAL THROMBOPLASTIN TIME) - Normal    Narrative:     +++++ New Range Effective 01/21/21 +++++    ** HEPARIN THERAPEUTIC RANGE: 62.4 - 94.8 SECONDS **  Please refer to the Heparin Protocol for Dosing    **CRITICAL VALUE IS >150.0 SECONDS**   CBC/DIFF    Narrative:     The following orders were created for panel order CBC/DIFF.  Procedure  Abnormality         Status                     ---------                               -----------         ------                     CBC WITH NWGN[[id number]]                Abnormal            Final result                 Please view results for these tests on the individual orders.   TROPONIN-T         Plan: Appropriate labs and imaging ordered. Medical Records reviewed.    MDM:     During the patient's stay in the emergency department, the above listed imaging and labs were performed to assist with medical decision making and was reviewed by myself when available for review.   Patient remained stable throughout the emergency department course.    Labs and imaging as above. Results  discussed with patient.  Advised to return to ED with any new, worsening, or concerning sxs.  Patient was given the opportunity to ask questions. All questions answered.  Patient demonstrated understanding and is agreeable to plan of discharge with appropriate follow up.   Discussed diagnosis and management including indications for emergent return, importance of close follow up and supportive care measures prior to patient discharge.           Medical Decision Making  Patient presented the emergency department secondary to concern for stroke.  Patient's last known normal was Monday prior to carotid surgery.  Patient is brought in by the son who states that over the past 2 days patient has had confusion.  Patient had carotid stent placed on Monday at [facility].  Patient with an NIH stroke scale 4 on arrival in the ED.  Patient obviously not a tPA or interventional candidate due to timing.  CT scan of his head is concerning for evolving right caudate infarct.  CTAs of his head in his neck were completed due to recent intervention.  There does appear to be some very distal right MCA occlusion which would make sense with the CT of the head findings.  Unfortunately, given that this is 4 days out and passed the M1 and M2 segment there is no intervention that is indicated at this time.  Patient will be given aspirin in the ED.  I did obtain records from [facility] and nose were placed in the chart.  Patient according to their documentation was confused after coming out of IR suite and had a CT scan of the head that was negative.  Patient became hypotensive and required Levophed for 2 days and then was discharged yesterday. Case discussed with hospitalist who is agreeable with admission.    Amount and/or Complexity of Data Reviewed  External Data Reviewed: labs, radiology and notes.  Labs: ordered. Decision-making details documented in ED Course.  Radiology: ordered and independent  interpretation performed.  ECG/medicine tests: ordered and independent interpretation performed.    Risk  Decision regarding hospitalization.      Impression:   Clinical Impression   Cerebrovascular  accident (CVA), unspecified mechanism (CMS HCC) (Primary)   Internal carotid artery stent present     Disposition:  Admitted      Patient will be admitted to Hospitalist service for further evaluation and management.    13:36: Care of patient will be transferred to Hospitalist.           Donnamarie Rossetti, DO  06/03/2023, 11:23

## 2023-06-03 NOTE — ED Nurses Note (Signed)
Pt bedding and male pure wick replace at this time to aid in pt comfort.

## 2023-06-03 NOTE — ED Triage Notes (Signed)
Weakness of left side, says he feels like he had another stroke, says he noticed his symptoms yesterday around 16:00

## 2023-06-03 NOTE — H&P (Signed)
CHIEF COMPLAINT  Left-sided weakness    HPI:    Derek Knox is a 72 y.o. male with a PMH of HTN, R carotid stenosis s/p stent 05/31/23 (IR [facility] [place]), CAD s/p stent, type 2 DM,GERD who presented to [facility] ED on 06/03/2023 due to worsening of left-sided weakness.    Patient's daughter who is MPOA is present at bedside and was the primary historian.  She reports that patient had a stroke back in May 2024 at which time was discovered he had a right-sided carotid stenosis.  Patient underwent stenting with IR at [facility] on 05/31/2023.  Daughter reports that the patient woke up groggy with some slurred speech but not significantly different from before the procedure.  She reports Tuesday the patient was still groggy and that she was told it was due to the medications he was being given.  She reports Tuesday night he was delirious and had become combative so he was given additional medications and that Wednesday morning he had worsening of left-sided weakness as well as slurring of speech.  She reports that the patient has had some left-sided weakness and slurred speech as well as confusion since a CVA from May of 2024.  She reports that there is somebody always living with him as he is not safe to live by himself and there was a concern for dementia though he has not been formally tested.  She endorses patient was cleared later that day to be discharged home despite worsening of symptoms.  Review of records showed the patient patient had become hypotensive and bradycardic requiring Levophed and IV fluids.  underwent a CT head there that was negative and CTA abdomen pelvis which was negative for source of bleed at that time they stopped his donepezil on atenolol.  Patient's daughter reports symptoms failed to improve regarding the left-sided worsened weakness and slurring of speech so she brought the patient to our ED instead.  In ED chronic appearing right lacunar infarct which is evolved and atrophy of  chronic microvascular ischemic disease.  CTA intra and extracranial showed a patent right internal carotid artery stent and no significant carotid or vertebral artery stenosis.  Also noted overall decreased small-vessel visualization of right frontal lobe compared to left suggestive of distal branch occlusions.  Right posterior cerebral artery stenosis was also noted.  Patient was outside of the window for thrombolytics and was not a candidate for intervention though ED discussed with vascular surgery to confirm.        PAST MEDICAL & SURGICAL HISTORIES:   Past Medical History:   Diagnosis Date    COPD (chronic obstructive pulmonary disease) (CMS HCC)     Diabetes mellitus, type 2 (CMS HCC)     GERD (gastroesophageal reflux disease)        Past Surgical History:   Procedure Laterality Date    HX BACK SURGERY      HX HIP REPLACEMENT Right        HOME MEDICATIONS:  Medications Prior to Admission       Prescriptions    aspirin 325 mg Oral Tablet    Take 1 Tablet (325 mg total) by mouth Once a day    budesonide-glycopyr-formoterol (BREZTRI AEROSPHERE) 160-9-4.8 mcg/actuation Inhalation HFA Aerosol Inhaler    Take 2 Puffs by inhalation Twice daily    clobetasoL (TEMOVATE) 0.05 % Cream    Apply topically Twice daily    donepeziL (ARICEPT) 10 mg Oral Tablet    Take 1 Tablet (10 mg  total) by mouth Every night    dutasteride (AVODART) 0.5 mg Oral Capsule    Take 1 Capsule (0.5 mg total) by mouth Once a day    glimepiride (AMARYL) 4 mg Oral Tablet    Take 1 Tablet (4 mg total) by mouth Every morning with breakfast    metFORMIN (GLUCOPHAGE) 850 mg Oral Tablet    Take 1 Tablet (850 mg total) by mouth Twice daily with food    pantoprazole (PROTONIX) 40 mg Oral Tablet, Delayed Release (E.C.)    Take 1 Tablet (40 mg total) by mouth Once a day    pioglitazone (ACTOS) 45 mg Oral Tablet    Take 1 Tablet (45 mg total) by mouth Once a day    tamsulosin (FLOMAX) 0.4 mg Oral Capsule    Take 2 Capsules (0.8 mg total) by mouth Once a day     tiotropium bromide (SPIRIVA RESPIMAT) 2.5 mcg/actuation Inhalation oral inhaler    Take 2 Inhalations (2 Puffs total) by inhalation Once a day            ALLERGIES:  Per chart  He is allergic to sulfa (sulfonamides).    FAMILY HISTORY:  Per chart  His family history is not on file.    SOCIAL HISTORY:  Per chart  He  has no history on file for alcohol use. He  reports that he has been smoking cigars. He has never used smokeless tobacco. He  has no history on file for drug use.      REVIEW OF SYSTEMS:    Review of Systems   Unable to perform ROS: Dementia          PHYSICAL EXAM:    BP 111/73   Pulse 58   Temp 36.5 C (97.7 F)   Resp 15   Ht 1.75 m (5' 8.9")   Wt 72.6 kg (160 lb)   SpO2 94%   BMI 23.70 kg/m      General-chronically ill-appearing 72 year old male no acute distress  Respiratory-CTAB, no cyanosis, no tachypnea   Cardiovascular-RRR, question systolic murmur, no pedal edema   GI-abdomen soft nontender   Neurologic-slight left facial droop with slight left-sided hemiparesis.  Question minimal slurred in speech.    LABS:    Results for orders placed or performed during the hospital encounter of 06/03/23 (from the past 24 hour(s))   BNP   Result Value Ref Range    NT-PROBNP 482 (H) 0 - 125 pg/mL   CBC/DIFF    Narrative    The following orders were created for panel order CBC/DIFF.  Procedure                               Abnormality         Status                     ---------                               -----------         ------                     CBC WITH XBMW[[id number]]                Abnormal            Final result  Please view results for these tests on the individual orders.   COMPREHENSIVE METABOLIC PANEL, NON-FASTING   Result Value Ref Range    SODIUM 140 133 - 144 mmol/L    POTASSIUM 4.5 3.2 - 5.0 mmol/L    CHLORIDE 107 (H) 96 - 106 mmol/L    CO2 TOTAL 23 22 - 30 mmol/L    ANION GAP 10 mmol/L    BUN 10 8 - 23 mg/dL    CREATININE 0.25 4.27 - 1.20 mg/dL    ESTIMATED GFR  69 (L) >90 mL/min/1.72m^2    ALBUMIN 3.9 3.5 - 5.2 g/dL    CALCIUM 9.2 8.3 - 06.2 mg/dL    GLUCOSE 97 74 - 376 mg/dL    ALKALINE PHOSPHATASE 91 35 - 129 U/L    ALT (SGPT) 23 0 - 41 U/L    AST (SGOT) 26 0 - 40 U/L    BILIRUBIN TOTAL 0.7 0.2 - 1.2 mg/dL    PROTEIN TOTAL 7.1 6.4 - 8.3 g/dL   TROPONIN-T NOW   Result Value Ref Range    TROPONIN-T 8 <=22 ng/L    Narrative    In order to distinguish acute elevations of high sensitive Troponin from other clinical conditions, the Universal Definition of myocardial infarction stresses clinical assessment and the need for serial measurements to observe a rise and/or fall above the upper limit of the reference interval.    TROPONIN-T IN TWO HOURS   Result Value Ref Range    TROPONIN-T 8 <=22 ng/L    Narrative    In order to distinguish acute elevations of high sensitive Troponin from other clinical conditions, the Universal Definition of myocardial infarction stresses clinical assessment and the need for serial measurements to observe a rise and/or fall above the upper limit of the reference interval.    MAGNESIUM   Result Value Ref Range    MAGNESIUM 2.3 1.6 - 2.4 mg/dL   PT/INR   Result Value Ref Range    PROTHROMBIN TIME 14.0 11.4 - 14.2 seconds    INR 1.06 0.86 - 1.14   PTT (PARTIAL THROMBOPLASTIN TIME)   Result Value Ref Range    APTT 30.5 24.0 - 35.6 seconds    Narrative    +++++ New Range Effective 01/21/21 +++++    ** HEPARIN THERAPEUTIC RANGE: 62.4 - 94.8 SECONDS **  Please refer to the Heparin Protocol for Dosing    **CRITICAL VALUE IS >150.0 SECONDS**   CBC WITH DIFF   Result Value Ref Range    WBC 7.5 3.7 - 11.0 x10^3/uL    RBC 4.66 4.50 - 6.10 x10^6/uL    HGB 13.3 (L) 13.4 - 17.5 g/dL    HCT 28.3 15.1 - 76.1 %    MCV 89.5 78.0 - 100.0 fL    MCH 28.5 26.0 - 32.0 pg    MCHC 31.9 31.0 - 35.5 g/dL    RDW-CV 60.7 (H) 37.1 - 15.5 %    PLATELETS 158 150 - 400 x10^3/uL    MPV 10.1 8.7 - 12.5 fL    NEUTROPHIL % 67.3 %    LYMPHOCYTE % 18.8 %    MONOCYTE % 10.3 %    EOSINOPHIL  % 2.6 %    BASOPHIL % 0.7 %    NEUTROPHIL # 5.02 1.50 - 7.70 x10^3/uL    LYMPHOCYTE # 1.40 1.00 - 4.80 x10^3/uL    MONOCYTE # 0.77 0.20 - 1.10 x10^3/uL    EOSINOPHIL # 0.19 <=0.50 x10^3/uL    BASOPHIL # <  0.10 <=0.20 x10^3/uL    IMMATURE GRANULOCYTE % 0.3 0.0 - 1.0 %    IMMATURE GRANULOCYTE # <0.10 <0.10 x10^3/uL        IMAGING AND STUDIES:    Results for orders placed or performed during the hospital encounter of 06/03/23 (from the past 72 hour(s))   CT BRAIN WO IV CONTRAST     Status: None    Narrative    Brydon E Andreoli    CT BRAIN WO IV CONTRAST performed on 06/03/2023 11:56 AM.    INDICATION:  CVA, L sided deficits since R CEA on Monday   Additional History:  left side weakness, CVA    TECHNIQUE:  Unenhanced CT head with axial, coronal, and sagittal multiplanar reformations. Dose modulation, automated exposure control, and/or iterative reconstruction were used for dose reduction.    COMPARISON:  Jan 26, 2023  ___________________________________  FINDINGS:    Interval evolution of a lacunar infarct in the right caudate nucleus which now has a chronic appearance. There is a stable lacunar infarct in the left caudate head as well. No hemorrhage, mass or midline shift. There is pronounced atrophy and chronic microvascular ischemic disease. Unremarkable bony calvarium.    ___________________________________    Impression    Chronic appearing right lacunar infarct which has evolved.  Atrophy and chronic microvascular ischemic disease as before.      Radiologist location ID: [id number]     XR AP MOBILE CHEST     Status: None    Narrative    Dorrian E Humiston    XR AP MOBILE CHEST performed on 06/03/2023 12:08 PM.    INDICATION:  Chest Pain   Additional History:  CP, weakness    TECHNIQUE:  Single portable frontal view of the chest.  1 views/1 images submitted for interpretation.    COMPARISON:  None available.  ___________________________________  FINDINGS:    No pneumothorax, pleural effusion, focal or diffuse  infiltrate is seen.      Heart size is normal. Pulmonary vascularity is within normal limits.           Impression    No acute cardiopulmonary process.            Radiologist location ID: [id number]     CT ANGIO INTRA-EXTRA CRANIAL W/WO CONTRAST     Status: None    Narrative    Kolston E Sharma    CT ANGIO INTRA-EXTRA CRANIAL W/WO CONTRAST performed on 06/03/2023 12:41 PM.    INDICATION:  Stroke   Additional History:  left side weakness, cva,    TECHNIQUE:  CT angiography of the head and neck with multiplanar, MIP, and 3D reformations.  All measurements of cervical carotid stenosis are performed according to NASCET criteria. Dose modulation, automated exposure control, and/or iterative reconstruction were used for dose reduction.    CONTRAST:  100 mL of Isovue 370 IV    COMPARISON:  None available.  ___________________________________  FINDINGS:    INTRACRANIAL CTA:  ANTERIOR CIRCULATION:  The distal cervical, petrous, cavernous, and supraclinoid portions of the internal carotid arteries are patent without any significant stenosis.    There is a small A1 segment of the right ACA. This may be developmental. Right ACA is unremarkable. Bilateral M1 and M2 segments are unremarkable. There appears to be overall decreased small vessel opacification in the right frontal lobe when compared to left side. Findings raise possibility of distal MCA small branch occlusion.    POSTERIOR CIRCULATION:  There is stenosis at the origin of  the P1 segment of the right PCA. There is another focal stenosis in the right PCA between the first and second segments. No evidence of vessel occlusion however. Left is unremarkable. The left vertebral artery is dominant. The basilar artery is patent.      EXTRACRANIAL CTA:  AORTIC ARCH:  There is a normal branching anatomy of the aortic arch.    CAROTID ARTERIES:    Right: Innominate and CCA mild to moderate mixed plaque. Calcified carotid bulb. ICA stent, patent. Distal ICA minimal disease. No  significant right carotid stenosis.    Left: Moderate carotid bulb and proximal ICA hard plaque. No significant carotid stenosis.    VERTEBRAL ARTERIES:  Patent bilateral vertebral arteries. Dominant left side.    ___________________________________    Impression    1. Patent right internal carotid artery stent.    2. No significant carotid or vertebral artery stenosis    3. Overall decrease small vessel visualization of the right frontal lobe compared to the left. Findings suggestive of small distal branch occlusion/occlusions.    4. Right posterior cerebral artery stenosis.      Radiologist location ID: [id number]         ASSESSMENT AND PLAN:    Derek Knox is a 72 y.o. male with a PMH of HTN, R carotid stenosis s/p stent 05/31/23 (IR [facility] [place]), CAD s/p stent, type 2 DM,GERD who presented to [facility] ED on 06/03/2023 due to worsening of left-sided weakness.    In ED chronic appearing right lacunar infarct which is evolved and atrophy of chronic microvascular ischemic disease.  CTA intra and extracranial showed a patent right internal carotid artery stent and no significant carotid or vertebral artery stenosis.  Also noted overall decreased small-vessel visualization of right frontal lobe compared to left suggestive of distal branch occlusions.  Right posterior cerebral artery stenosis was also noted.  Patient was outside of the window for thrombolytics and was not a candidate for intervention though ED discussed with vascular surgery to confirm.    Concern for new CVA in context of known history of CVAs and likely vascular dementia (not formally tested for dementia)  Right ICA stenosis s/p stent on 05/31/2023  -It admit inpatient place on telemetry   -obtain MRI brain  -consult vascular surgery  -continue aspirin and Plavix  -optimize blood pressure control and restart atenolol when able   -follow-up outpatient in 1 month with vascular surgery  -will obtain bilateral carotid duplex in 6 months    -neurology consulted   -restart aspirin, Plavix, statin        Chronic medical problems:  Gastroesophageal reflux disease-continue PPI   CAD s/p stent (greater than 15 years ago)-antiplatelets and statin as above   Type 2 DM-HGB A1c 6.9 on 04/05/2023, SSI while IP   Hypertension-not currently on pharmacotherapy would monitor while inpatient    Prophylaxis   deep vein thrombosis Lovenox    Medical decision-making:  Problems   At least 1 new problem that is severe/life-threatening     Risk   Decision regarding hospitalization-admit inpatient on telemetry     Level 3 H&P high complexity    This note was written using voice dictation software.  There may be errors in grammar, punctuation, and syntax inherent with the use of such software that was not caught during editing process.  The original meaning can be determined through context.    Janyth Pupa Morse, PA-C  06/03/2023, 15:28,    Addendum   Patient was independently seen  by me and APP.  I reviewed labs, imaging, orders, medications and was available for discussion. I agree with the assessment and plan documented.

## 2023-06-04 DIAGNOSIS — I6521 Occlusion and stenosis of right carotid artery: Secondary | ICD-10-CM

## 2023-06-04 DIAGNOSIS — I69354 Hemiplegia and hemiparesis following cerebral infarction affecting left non-dominant side: Secondary | ICD-10-CM

## 2023-06-04 LAB — CBC
HCT: 42 % (ref 38.9–52.0)
HGB: 13.4 g/dL (ref 13.4–17.5)
MCH: 28.6 pg (ref 26.0–32.0)
MCHC: 31.9 g/dL (ref 31.0–35.5)
MCV: 89.6 fL (ref 78.0–100.0)
MPV: 10.4 fL (ref 8.7–12.5)
PLATELETS: 162 10*3/uL (ref 150–400)
RBC: 4.69 10*6/uL (ref 4.50–6.10)
RDW-CV: 15.6 % — ABNORMAL HIGH (ref 11.5–15.5)
WBC: 6.1 10*3/uL (ref 3.7–11.0)

## 2023-06-04 LAB — BASIC METABOLIC PANEL
ANION GAP: 12 mmol/L
BUN/CREA RATIO: 8
BUN: 9 mg/dL (ref 8–23)
CALCIUM: 8.8 mg/dL (ref 8.3–10.7)
CHLORIDE: 105 mmol/L (ref 96–106)
CO2 TOTAL: 22 mmol/L (ref 22–30)
CREATININE: 1.12 mg/dL (ref 0.70–1.20)
ESTIMATED GFR: 69 mL/min/{1.73_m2} — ABNORMAL LOW (ref >90)
GLUCOSE: 96 mg/dL (ref 74–109)
POTASSIUM: 4 mmol/L (ref 3.2–5.0)
SODIUM: 139 mmol/L (ref 133–144)

## 2023-06-04 LAB — MAGNESIUM: MAGNESIUM: 2.2 mg/dL (ref 1.6–2.4)

## 2023-06-04 LAB — PHOSPHORUS: PHOSPHORUS: 3.8 mg/dL (ref 2.5–4.5)

## 2023-06-04 NOTE — ED Nurses Note (Signed)
Pt removed his male pure wick and got himself dressed then requested to be taken to the restroom for a BM. Safety sitter placed with the pt at this time

## 2023-06-04 NOTE — Care Plan (Signed)
[facility]   [address]  [place], [place] [address]  (Office) [phone]  Rehabilitation Services  Physical Therapy Inpatient Initial Evaluation      Patient Name: Derek Knox  Date of Birth: [birth year]  Height: Height: 175 cm (5' 8.9")  Weight: Weight: 72.6 kg (160 lb)  Room/Bed: ED08/ED08  Payor: [employer] MEDICARE / Plan: [employer] CHOICE PPO / Product Type: PPO /     PT Orders received and Chart Review:   Yes  Post Acute Care Recommendations:  HHPT  Weight Bearing Status:  No Restrictions  Precautions:   Fall Risk    Equipment:   straight cane    Pain Level: 0/10  Pain Level After: 0/10      Assessment:     PT Diagnosis:  R26.2 Difficulty in walking (not elsewhere classified)    Impairments:  Decreased ability to ambulate     (P) The patient presents with CVA with recent ICA stent.  They have now balance impairment.  The patient is mildly limited with bed mobility, mildly limited with transfers, and mild limited with ambulation.  The patient may be mildly restricted with mobility in the home.  The patient would be most appropriate for home health physical therapy.    Continue with goals until met or discharged.   See flowsheet for goals.    Discharge Needs:     Equipment Needs:  NONE     The patient presents with mobility limitations due to impaired balance and impaired functional activity tolerance that significantly impair/prevent patient's ability to participate in mobility-related activities of daily living (MRADLs) including  ambulation and transfers in order to safely complete. This functional mobility deficit can be sufficiently resolved with the use of a Cane in order to decrease the risk of falls, morbidity, and mortality in performance of these MRADLs.  Patient without treatment is at risk for further decline and loss of independence.    JUSTIFICATION OF DISCHARGE RECOMMENDATION   Based on current diagnosis, functional performance prior to admission, and current  functional performance, this patient requires continued PT services in (P) home with home health in order to achieve significant functional improvements in these deficit areas: (P) gait, locomotion, and balance, muscle performance.    Plan:   Current Intervention: (P) balance training, gait training, bed mobility training, neuromuscular re-education, strengthening, transfer training  To provide physical therapy services (P) minimum of 3x/week  for duration of (P) until discharge.  Frequency: Patient will be seen once daily 3-5 times per week    The risks/benefits of therapy have been discussed with the patient/caregiver and he/she is in agreement with the established plan of care.       Subjective & Objective     Past Medical History:   Diagnosis Date    COPD (chronic obstructive pulmonary disease) (CMS HCC)     Diabetes mellitus, type 2 (CMS HCC)     GERD (gastroesophageal reflux disease)             Past Surgical History:   Procedure Laterality Date    HX BACK SURGERY      HX HIP REPLACEMENT Right             Flowsheet    06/04/23 1335   Rehab Session   Document Type evaluation   PT Visit Date 06/04/23   Total PT Minutes: 15   Patient Effort good   Symptoms Noted During/After Treatment fatigue   General Information   Patient  Profile Reviewed yes   Mutuality/Individual Preferences   Anxieties, Fears or Concerns difficulty walking   Individualized Care Needs assist with ambulation   Patient-Specific Goals (Include Timeframe) ambulate in hallway with cane and assist by discharge   Plan of Care Reviewed With patient   Living Environment   Lives With child(ren), adult   Living Arrangements house   Home Assessment: Stairs in Home   Home Accessibility stairs within home;tub/shower is not walk in   Functional Level Prior   Ambulation 1 - assistive equipment   Transferring 1 - assistive equipment   Toileting 0 - independent   Bathing 0 - independent   Dressing 0 - independent   Eating 0 - independent   Communication 0 -  understands/communicates without difficulty   Self-Care   Equipment Currently Used at Home yes   Equipment Currently Used at Home cane, Field seismologist, front wheeled   Pre Treatment Status   Pre Treatment Patient Status Patient supine in bed   Communication Pre Treatment  Nurse   Cognitive Assessment/Interventions   Behavior/Mood Observations cooperative   Orientation Status oriented x 4   Attention WNL/WFL   Follows Commands WNL   Bed Mobility Assessment/Treatment   Supine-Sit Independence stand-by assistance   Sit to Supine, Independence stand-by assistance   Transfer Assessment/Treatment   Sit-Stand Independence contact guard assist   Stand-Sit Independence contact guard assist   Sit-Stand-Sit, Assist Device walker, front wheeled   Gait Assessment/Treatment   Independence  contact guard assist   Assistive Device  straight cane   Distance in Feet 150   Balance   Sitting Balance: Static good balance   Sitting, Dynamic (Balance) good balance   Sit-to-Stand Balance good balance   Standing Balance: Static fair balance   Standing Balance: Dynamic fair balance   Motor Coordination Assessment/Training   Comment, Impairments Advertising copywriter (Gross Motor Skills) no deficits noted   Comment, Fine Motor Coordination Training no deficits noted   Post Treatment Status   Post Treatment Patient Status Patient supine in bed;Call light within reach;Patient safety alarm activated   Communication Post Treatement Nurse   Plan of Care Review   Plan Of Care Reviewed With patient   Physical Therapy Clinical Impression   Assessment The patient presents with CVA with recent ICA stent.  They have now balance impairment.  The patient is mildly limited with bed mobility, mildly limited with transfers, and mild limited with ambulation.  The patient may be mildly restricted with mobility in the home.  The patient would be most appropriate for home health physical therapy.   Impairments Found (describe specific impairments) gait,  locomotion, and balance;muscle performance   Functional Limitations in Following  self-care;home management   Therapy Frequency minimum of 3x/week   Predicted Duration of Therapy Intervention (days/wks) until discharge   Anticipated Equipment Needs at Discharge (PT) none anticipated   Anticipated Discharge Disposition home with home health   Care Plan Goals   PT Rehab Goals Bed Mobility Goal;Gait Training Goal;Transfer Training Goal   Bed Mobility Goal   Bed Mobility Goal, Date Established 06/04/23   Bed Mobility Goal, Time to Achieve by discharge   Bed Mobility Goal, Activity Type supine to sit/sit to supine   Bed Mobility Goal, Independence Level stand-by assistance   Bed Mobility Goal, Assistive Device bed rails   Gait Training  Goal, Distance to Achieve   Gait Training  Goal, Date Established 06/04/23   Gait Training  Goal, Time to Achieve by discharge   Gait  Training  Goal, Independence Level modified independence   Gait Training  Goal, Assist Device cane, straight   Gait Training  Goal, Distance to Achieve 200   Transfer Training Goal   Transfer Training Goal, Date Established 06/04/23   Transfer Training Goal, Time to Achieve by discharge   Transfer Training Goal, Activity Type sit-to-stand/stand-to-sit   Transfer Training Goal, Independence Level stand-by assistance   Transfer Training Goal, Assist Device cane, straight   Planned Therapy Interventions, PT Eval   Planned Therapy Interventions (PT) balance training;gait training;bed mobility training;neuromuscular re-education;strengthening;transfer training       Nursing Notified: Yes    Patient left:  Back to Bed and Call light in reach    Plan discussed with: Patient    INTERVENTION MINUTES: EVALUATION 15 minutes    EVALUATION COMPLEXITY : LOW-HISTORY 0, EXAMINATION 1-2, STABLE PRESENTATION    Therapist:     Aurther Loft, PT  06/04/2023, 15:03

## 2023-06-04 NOTE — Care Management Notes (Signed)
[facility] Management Initial Evaluation    Patient Name: Derek Knox  Date of Birth: Jan 09, 1951  Sex: male  Date/Time of Admission: 06/03/2023 11:12 AM  Room/Bed: ED08/ED08  Payor: HUMANA MEDICARE / Plan: Francine Graven CHOICE PPO / Product Type: PPO /   Primary Care Providers:  Forest Becker, DO (General)    Pharmacy Info:   Preferred Pharmacy       [facility] [phone] - [place], [place] - [address]    [address] [place] [address]    Phone: [phone] Fax: [phone]    Hours: Not open 24 hours          Emergency Contact Info:   Extended Emergency Contact Information  Primary Emergency Contact: Derek Knox  Mobile Phone: [phone]  Relation: Son  Preferred language: English  Interpreter needed? No    History:   Derek Knox is a 72 y.o., male, admitted 06/03/23    Height/Weight: 175 cm (5' 8.9") / 72.6 kg (160 lb)     LOS: 0 days   Admitting Diagnosis: CVA (cerebral vascular accident) (CMS [facility]) [I63.9]    Assessment: Patient has had long history since mid May. He went to [facility] with stroke like symptoms and was transferred to [facility]. He said he was there from May 13 through June. From there he went to [facility]. He was there 2 wks and did well. He had [facility] at discharge. His testing showed that he had an 80% blockage per ultrasound so he was scheduled for Carotid surgery.  Daughter said this was done Monday 9/30. She said 1st day post op he started having some confusion, weakness, slurred speech, similar symptoms he had with the stroke. They kept telling her it was his clearing the anesthesia. It became worse so she brought him to our ER.  He has been seen by Neurology. He feels that he needs to go to acute rehab again. Speech has seen patient. PT/OT are still to see. Patient asked why he needs to go somewhere else for therapy. Thinks he is in a therapy facility now.        Discharge Plan:  Undetermined  at this time      The patient will continue to be evaluated for developing discharge needs.     Case Manager: Cooper Render, RN  Phone: [phone]

## 2023-06-04 NOTE — ED Nurses Note (Signed)
Breakfast tray provided. 

## 2023-06-04 NOTE — ED Nurses Note (Signed)
Pt resting with eyes closed. No noted distress

## 2023-06-04 NOTE — ED Nurses Note (Signed)
Pt resting in bed, watching TV. No s/s of distress noted. Pt denies needs and complaints at this time. Call light and personal items in reach.

## 2023-06-04 NOTE — Care Plan (Signed)
[facility]   [address]  [place], [address]  (Office) [phone]-    Rehabilitation Services    Speech Therapy Inpatient Daily Treatment Note          Patient Name: Derek Knox  Date of Birth: August 14, 1951  Weight:  Weight: 72.6 kg (160 lb)  Room/Bed: ED08/ED08  Payor: HUMANA MEDICARE / Plan: HUMANA CHOICE PPO / Product Type: PPO /      06/04/23 1010   Rehab Session   SLP Visit Date 06/04/23   Total SLP Minutes: 10   General Information   Patient Profile Reviewed yes   Referring Physician Ramakrishnaiah   Mutuality/Individual Preferences   Anxieties, Fears or Concerns None voiced at this time   Patient-Specific Goals (Include Timeframe) Swallow free from aspiration   Cognitive   Level of Consciousness alert   SLP Clinical Impression   Assessment Functional oropharyngeal phase swallow       Subjective: Pt awake and agreeable to tx feed.Daughter at bedside. Pt and daughter report pt with no difficulties swallowing.    Objective: Pt positioned fully upright and given thin and solid consistencies. Thin given via cup and straw.    Assessment:   Oral Phase: Adequate lip closure with no labial escape. Timely chewing with solid with complete oral clearance.    Pharyngeal Phase: No coughing/choking or wet vocal quality observed suggesting a functional pharyngeal phase swallow. Silent aspiration is not ruled out at bedside.    Plan: Consider continuation of current diet with regular, thin liquids. ST will sign off at this time.    Speech intervention minutes: SWALLOW TREATMENT 10 MINUTES    Continue to follow patient according to established plan of care. The risks/benefits of therapy have been discussed with the patient/caregiver and he/she is in agreement with the established plan of care.     Thank you,     Carlota Raspberry, ST  06/04/2023  11:28

## 2023-06-04 NOTE — ED Nurses Note (Signed)
Pt is resting on his ec bed in a position of comfort.

## 2023-06-04 NOTE — ED Nurses Note (Signed)
Called report to Dorene Sorrow RN, patient prepared for transport

## 2023-06-04 NOTE — Progress Notes (Addendum)
[facility]  Progress Note    Derek Knox  Date of service: 06/04/2023   Date of Admission:  06/03/2023    Hospital Day:  LOS: 1 day   Subjective: Derek Knox is a 72 y.o. male with a PMH of HTN, R carotid stenosis s/p stent 05/31/23 (IR [place] ), CAD s/p stent, type 2 DM,GERD who presented to [facility] ED on 06/03/2023 due to worsening of left-sided weakness.    Patient's daughter who is MPOA is present at bedside and was the primary historian.  She reports that patient had a stroke back in May 2024 at which time was discovered he had a right-sided carotid stenosis.  Patient underwent stenting with IR at [facility] on 05/31/2023.  Daughter reports that the patient woke up groggy with some slurred speech but not significantly different from before the procedure.  She reports Tuesday the patient was still groggy and that she was told it was due to the medications he was being given.  She reports Tuesday night he was delirious and had become combative so he was given additional medications and that Wednesday morning he had worsening of left-sided weakness as well as slurring of speech.  She reports that the patient has had some left-sided weakness and slurred speech as well as confusion since a CVA from May of 2024.  She reports that there is somebody always living with him as he is not safe to live by himself and there was a concern for dementia though he has not been formally tested.  She endorses patient was cleared later that day to be discharged home despite worsening of symptoms.  Review of records showed the patient patient had become hypotensive and bradycardic requiring Levophed and IV fluids.  underwent a CT head there that was negative and CTA abdomen pelvis which was negative for source of bleed at that time they stopped his donepezil on atenolol.  Patient's daughter reports symptoms failed to improve regarding the left-sided worsened weakness and slurring of speech so  she brought the patient to our ED instead.  In ED chronic appearing right lacunar infarct which is evolved and atrophy of chronic microvascular ischemic disease.  CTA intra and extracranial showed a patent right internal carotid artery stent and no significant carotid or vertebral artery stenosis.  Also noted overall decreased small-vessel visualization of right frontal lobe compared to left suggestive of distal branch occlusions.  Right posterior cerebral artery stenosis was also noted.  Patient was outside of the window for thrombolytics and was not a candidate for intervention though ED discussed with vascular surgery to confirm.    06/04/2023: Neuro evaluated patient yesterday and suggested symptoms are more consistent with a recrudescence of the old stroke. Recommended consideration of rehab upon discharge. PT and speech to evaluate today. Vascular also evaluated patient and recommended no intervention at this time. Patient apparently had an episode where he removed his male PureWick, got himself dressed, then requested to be taken to the restroom for a bowel movement.  A safety sitter was placed with the patient at that time. Patient was seen and examined at the bedside this morning.  He states that he feels okay.  For nursing, he was not oriented to place.  However, on my examination, he was alert, oriented to person, place, and time.  However, he was unsure of why he presented to the hospital.  Daughter at bedside states that she would like him to go to rehab after discharge.  States that after his CVA, he was at [facility] for a period of time and did well there.  She states that when he goes home, he has been staying with her brother, so he will have someone to help care for him.  They are also looking at placement at assisted living for long-term options.  Patient has no acute complaints.    ROS: Negative unless explicitly listed in HPI.    Vital Signs:  Temp  Avg: 36.5 C (97.7 F)  Min: 36.5 C (97.7 F)   Max: 36.5 C (97.7 F)    Pulse  Avg: 59.4  Min: 53  Max: 64 BP  Min: 96/79  Max: 123/61   Resp  Avg: 16.6  Min: 13  Max: 20 SpO2  Avg: 97.3 %  Min: 91 %  Max: 100 %          Input/Output  No intake or output data in the 24 hours ending 06/04/23 0742 I/O last shift:  No intake/output data recorded.   aspirin chewable tablet 81 mg, 81 mg, Oral, Daily  atorvastatin (LIPITOR) tablet, 80 mg, Oral, QPM  clopidogrel (PLAVIX) 75 mg tablet, 75 mg, Oral, Daily  enoxaparin PF (LOVENOX) 40 mg/0.4 mL SubQ injection, 40 mg, Subcutaneous, Q24H  finasteride (PROSCAR) tablet, 5 mg, Oral, Daily before Breakfast  pantoprazole (PROTONIX) delayed release tablet, 40 mg, Oral, Daily  tamsulosin (FLOMAX) capsule, 0.8 mg, Oral, Daily        Physical Exam:    Age-appropriate looking, Not in acute distress   Cardiovascular: RRR, no murmurs  Respiratory: Clear to auscultation.  No wheezing  Abdomen: Soft, nontender, nondistended.  No  organomegaly  Neurologic: Alert and oriented x 3, moving all extremities.  Psychiatry : appropriate mood and affect    Labs:     Results for orders placed or performed during the hospital encounter of 06/03/23 (from the past 24 hour(s))   BNP   Result Value Ref Range    NT-PROBNP 482 (H) 0 - 125 pg/mL   COMPREHENSIVE METABOLIC PANEL, NON-FASTING   Result Value Ref Range    SODIUM 140 133 - 144 mmol/L    POTASSIUM 4.5 3.2 - 5.0 mmol/L    CHLORIDE 107 (H) 96 - 106 mmol/L    CO2 TOTAL 23 22 - 30 mmol/L    ANION GAP 10 mmol/L    BUN 10 8 - 23 mg/dL    CREATININE 1.61 0.96 - 1.20 mg/dL    ESTIMATED GFR 69 (L) >90 mL/min/1.24m^2    ALBUMIN 3.9 3.5 - 5.2 g/dL    CALCIUM 9.2 8.3 - 04.5 mg/dL    GLUCOSE 97 74 - 409 mg/dL    ALKALINE PHOSPHATASE 91 35 - 129 U/L    ALT (SGPT) 23 0 - 41 U/L    AST (SGOT) 26 0 - 40 U/L    BILIRUBIN TOTAL 0.7 0.2 - 1.2 mg/dL    PROTEIN TOTAL 7.1 6.4 - 8.3 g/dL   TROPONIN-T NOW   Result Value Ref Range    TROPONIN-T 8 <=22 ng/L   MAGNESIUM   Result Value Ref Range    MAGNESIUM 2.3 1.6 - 2.4  mg/dL   PT/INR   Result Value Ref Range    PROTHROMBIN TIME 14.0 11.4 - 14.2 seconds    INR 1.06 0.86 - 1.14   PTT (PARTIAL THROMBOPLASTIN TIME)   Result Value Ref Range    APTT 30.5 24.0 - 35.6 seconds   CBC WITH DIFF   Result Value  Ref Range    WBC 7.5 3.7 - 11.0 x10^3/uL    RBC 4.66 4.50 - 6.10 x10^6/uL    HGB 13.3 (L) 13.4 - 17.5 g/dL    HCT 02.7 25.3 - 66.4 %    MCV 89.5 78.0 - 100.0 fL    MCH 28.5 26.0 - 32.0 pg    MCHC 31.9 31.0 - 35.5 g/dL    RDW-CV 40.3 (H) 47.4 - 15.5 %    PLATELETS 158 150 - 400 x10^3/uL    MPV 10.1 8.7 - 12.5 fL    NEUTROPHIL % 67.3 %    LYMPHOCYTE % 18.8 %    MONOCYTE % 10.3 %    EOSINOPHIL % 2.6 %    BASOPHIL % 0.7 %    NEUTROPHIL # 5.02 1.50 - 7.70 x10^3/uL    LYMPHOCYTE # 1.40 1.00 - 4.80 x10^3/uL    MONOCYTE # 0.77 0.20 - 1.10 x10^3/uL    EOSINOPHIL # 0.19 <=0.50 x10^3/uL    BASOPHIL # <0.10 <=0.20 x10^3/uL    IMMATURE GRANULOCYTE % 0.3 0.0 - 1.0 %    IMMATURE GRANULOCYTE # <0.10 <0.10 x10^3/uL   ECG 12 LEAD   Result Value Ref Range    Ventricular rate 54 BPM    Atrial Rate 54 BPM    PR Interval 182 ms    QRS Duration 78 ms    QT Interval 454 ms    QTC Calculation 430 ms    Calculated P Axis 63 degrees    Calculated R Axis 42 degrees    Calculated T Axis 68 degrees   TROPONIN-T IN TWO HOURS   Result Value Ref Range    TROPONIN-T 8 <=22 ng/L   CBC   Result Value Ref Range    WBC 6.1 3.7 - 11.0 x10^3/uL    RBC 4.69 4.50 - 6.10 x10^6/uL    HGB 13.4 13.4 - 17.5 g/dL    HCT 25.9 56.3 - 87.5 %    MCV 89.6 78.0 - 100.0 fL    MCH 28.6 26.0 - 32.0 pg    MCHC 31.9 31.0 - 35.5 g/dL    RDW-CV 64.3 (H) 32.9 - 15.5 %    PLATELETS 162 150 - 400 x10^3/uL    MPV 10.4 8.7 - 12.5 fL   BASIC METABOLIC PANEL   Result Value Ref Range    SODIUM 139 133 - 144 mmol/L    POTASSIUM 4.0 3.2 - 5.0 mmol/L    CHLORIDE 105 96 - 106 mmol/L    CO2 TOTAL 22 22 - 30 mmol/L    ANION GAP 12 mmol/L    CALCIUM 8.8 8.3 - 10.7 mg/dL    GLUCOSE 96 74 - 518 mg/dL    BUN 9 8 - 23 mg/dL    CREATININE 8.41 6.60 - 1.20 mg/dL     BUN/CREA RATIO 8     ESTIMATED GFR 69 (L) >90 mL/min/1.66m^2   MAGNESIUM   Result Value Ref Range    MAGNESIUM 2.2 1.6 - 2.4 mg/dL   PHOSPHORUS   Result Value Ref Range    PHOSPHORUS 3.8 2.5 - 4.5 mg/dL      Radiology:      Results for orders placed or performed during the hospital encounter of 06/03/23 (from the past 2160 hour(s))   CT BRAIN WO IV CONTRAST     Status: None    Narrative    Derek Knox    CT BRAIN WO IV CONTRAST performed on 06/03/2023 11:56 AM.  INDICATION:  CVA, L sided deficits since R CEA on Monday   Additional History:  left side weakness, CVA    TECHNIQUE:  Unenhanced CT head with axial, coronal, and sagittal multiplanar reformations. Dose modulation, automated exposure control, and/or iterative reconstruction were used for dose reduction.    COMPARISON:  Jan 26, 2023  ___________________________________  FINDINGS:    Interval evolution of a lacunar infarct in the right caudate nucleus which now has a chronic appearance. There is a stable lacunar infarct in the left caudate head as well. No hemorrhage, mass or midline shift. There is pronounced atrophy and chronic microvascular ischemic disease. Unremarkable bony calvarium.    ___________________________________    Impression    Chronic appearing right lacunar infarct which has evolved.  Atrophy and chronic microvascular ischemic disease as before.      Radiologist location ID: [id number]     XR AP MOBILE CHEST     Status: None    Narrative    Derek Knox    XR AP MOBILE CHEST performed on 06/03/2023 12:08 PM.    INDICATION:  Chest Pain   Additional History:  CP, weakness    TECHNIQUE:  Single portable frontal view of the chest.  1 views/1 images submitted for interpretation.    COMPARISON:  None available.  ___________________________________  FINDINGS:    No pneumothorax, pleural effusion, focal or diffuse infiltrate is seen.      Heart size is normal. Pulmonary vascularity is within normal limits.           Impression    No acute  cardiopulmonary process.            Radiologist location ID: [id number]     CT ANGIO INTRA-EXTRA CRANIAL W/WO CONTRAST     Status: None    Narrative    Derek Knox    CT ANGIO INTRA-EXTRA CRANIAL W/WO CONTRAST performed on 06/03/2023 12:41 PM.    INDICATION:  Stroke   Additional History:  left side weakness, cva,    TECHNIQUE:  CT angiography of the head and neck with multiplanar, MIP, and 3D reformations.  All measurements of cervical carotid stenosis are performed according to NASCET criteria. Dose modulation, automated exposure control, and/or iterative reconstruction were used for dose reduction.    CONTRAST:  100 mL of Isovue 370 IV    COMPARISON:  None available.  ___________________________________  FINDINGS:    INTRACRANIAL CTA:  ANTERIOR CIRCULATION:  The distal cervical, petrous, cavernous, and supraclinoid portions of the internal carotid arteries are patent without any significant stenosis.    There is a small A1 segment of the right ACA. This may be developmental. Right ACA is unremarkable. Bilateral M1 and M2 segments are unremarkable. There appears to be overall decreased small vessel opacification in the right frontal lobe when compared to left side. Findings raise possibility of distal MCA small branch occlusion.    POSTERIOR CIRCULATION:  There is stenosis at the origin of the P1 segment of the right PCA. There is another focal stenosis in the right PCA between the first and second segments. No evidence of vessel occlusion however. Left is unremarkable. The left vertebral artery is dominant. The basilar artery is patent.      EXTRACRANIAL CTA:  AORTIC ARCH:  There is a normal branching anatomy of the aortic arch.    CAROTID ARTERIES:    Right: Innominate and CCA mild to moderate mixed plaque. Calcified carotid bulb. ICA stent, patent. Distal ICA minimal disease. No significant  right carotid stenosis.    Left: Moderate carotid bulb and proximal ICA hard plaque. No significant carotid  stenosis.    VERTEBRAL ARTERIES:  Patent bilateral vertebral arteries. Dominant left side.    ___________________________________    Impression    1. Patent right internal carotid artery stent.    2. No significant carotid or vertebral artery stenosis    3. Overall decrease small vessel visualization of the right frontal lobe compared to the left. Findings suggestive of small distal branch occlusion/occlusions.    4. Right posterior cerebral artery stenosis.      Radiologist location ID: [id number]     MRI BRAIN WO CONTRAST     Status: None    Narrative    Derek Knox    MRI BRAIN WO CONTRAST performed on 06/03/2023 5:02 PM.    INDICATION:  acute stroke   Additional History:  Slurred speech with left facial droop s/p carotid stent placement on 05/31/23. Hx CVA 12/2022. Dementia    TECHNIQUE:  Multiplanar, multisequence MRI brain performed without contrast.    COMPARISON:  MRI June 2015, CT head today  ___________________________________  FINDINGS:    No evidence for acute CVA on diffusion-weighted images.    Ventricles and subarachnoid spaces are prominent compatible with parenchymal volume loss.    Extensive chronic small vessel ischemic changes cerebral hemispheres with old periventricular lacunar infarctions. There are also chronic small vessel ischemic changes within the pons.    No evidence for intracranial hemorrhage or intracranial mass.    Paranasal sinuses and mastoids well aerated.    ___________________________________    Impression    Chronic small vessel ischemic changes cerebral hemispheres and brainstem.    No intracranial hemorrhage or intracranial mass. No evidence for acute CVA.      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    Facial weakness due to recent  stroke     Concern for new CVA in context of known history of CVAs and likely vascular dementia (not formally tested for dementia)  Right ICA stenosis s/p stent on 05/31/2023  -It admit inpatient place on telemetry   -obtain MRI brain- unremarkable for acute CVA  -consult vascular surgery- recommended no intervention at this time  -continue aspirin and Plavix  -optimize blood pressure control and restart atenolol when able   -follow-up outpatient in 1 month with vascular surgery  -will obtain bilateral carotid duplex in 6 months   -neurology consulted - recommended evaluation for rehab post discharge, no acute intervention at this time  -restart aspirin, Plavix, statin         Chronic medical problems:  Gastroesophageal reflux disease-continue PPI   CAD s/p stent (greater than 15 years ago)-antiplatelets and statin as above   Type 2 DM-HGB A1c 6.9 on 04/05/2023, SSI while inpatient. While here, monitor glucoses ACHS, PRN.   Hypertension-not currently on pharmacotherapy would monitor while inpatient      DVT/PE Prophylaxis: Enoxaparin    Herma Ard, NP    Addendum   Patient was independently seen by me and APP.  I reviewed labs, imaging, orders, medications and was available for discussion. I agree with the assessment and plan documented.  He was placed on  one-to-one sitter last night due to confusion. Patient was awake and alert, oriented x3 with intermittent confusion.  Discontinued one-to-one as patient's mental status better.   He agreed with rehab but given the dementia,  will discuss with patient's daughter regarding discharge disposition to rehab versus home.

## 2023-06-04 NOTE — ED Nurses Note (Signed)
Per. DR. Tacy Learn DC 1:1 safety at this time and see how pt does.

## 2023-06-05 ENCOUNTER — Encounter (HOSPITAL_COMMUNITY): Payer: Self-pay | Admitting: Student in an Organized Health Care Education/Training Program

## 2023-06-05 DIAGNOSIS — R001 Bradycardia, unspecified: Secondary | ICD-10-CM

## 2023-06-05 LAB — CBC
HCT: 46.2 % (ref 38.9–52.0)
HGB: 14.7 g/dL (ref 13.4–17.5)
MCH: 28.9 pg (ref 26.0–32.0)
MCHC: 31.8 g/dL (ref 31.0–35.5)
MCV: 90.8 fL (ref 78.0–100.0)
MPV: 9.8 fL (ref 8.7–12.5)
PLATELETS: 174 10*3/uL (ref 150–400)
RBC: 5.09 10*6/uL (ref 4.50–6.10)
RDW-CV: 15.4 % (ref 11.5–15.5)
WBC: 6.9 10*3/uL (ref 3.7–11.0)

## 2023-06-05 LAB — MAGNESIUM: MAGNESIUM: 1.9 mg/dL (ref 1.6–2.4)

## 2023-06-05 LAB — ECG 12 LEAD
Atrial Rate: 54 {beats}/min
Calculated P Axis: 63 degrees
Calculated R Axis: 42 degrees
Calculated T Axis: 68 degrees
PR Interval: 182 ms
QRS Duration: 78 ms
QT Interval: 454 ms
QTC Calculation: 430 ms
Ventricular rate: 54 {beats}/min

## 2023-06-05 LAB — BASIC METABOLIC PANEL
ANION GAP: 13 mmol/L
BUN/CREA RATIO: 12
BUN: 13 mg/dL (ref 8–23)
CALCIUM: 9.6 mg/dL (ref 8.3–10.7)
CHLORIDE: 104 mmol/L (ref 96–106)
CO2 TOTAL: 21 mmol/L — ABNORMAL LOW (ref 22–30)
CREATININE: 1.09 mg/dL (ref 0.70–1.20)
ESTIMATED GFR: 72 mL/min/{1.73_m2} — ABNORMAL LOW (ref >90)
GLUCOSE: 124 mg/dL — ABNORMAL HIGH (ref 74–109)
POTASSIUM: 4.2 mmol/L (ref 3.2–5.0)
SODIUM: 138 mmol/L (ref 133–144)

## 2023-06-05 LAB — PHOSPHORUS: PHOSPHORUS: 4.3 mg/dL (ref 2.5–4.5)

## 2023-06-05 NOTE — Progress Notes (Signed)
[place] [facility] Progress Note    Derek Knox       72 y.o.       Date of service: 06/05/2023  Date of Admission:  06/03/2023    Hospital Day:  LOS: 0 days   CC: CVA (cerebral vascular accident) (CMS HCC)    Subjective:  Patient was seen and examined at bedside this morning.  He denied any new complaints.  Tried to call patient's son to discuss discharge planning, did not answer.      Objective:   Filed Vitals:    06/05/23 0500 06/05/23 0700 06/05/23 1130 06/05/23 1514   BP: 113/79 111/71 111/71 114/78   Pulse: 74 79 85 79   Resp: 16 16 16 17    Temp: 36.8 C (98.2 F) 36.7 C (98.1 F) 36.5 C (97.7 F) 36.6 C (97.9 F)   SpO2: 96% 97% 94% 97%     Oxygen Device  SpO2: 97 %    aspirin chewable tablet 81 mg, 81 mg, Oral, Daily  atorvastatin (LIPITOR) tablet, 80 mg, Oral, QPM  clopidogrel (PLAVIX) 75 mg tablet, 75 mg, Oral, Daily  enoxaparin PF (LOVENOX) 40 mg/0.4 mL SubQ injection, 40 mg, Subcutaneous, Q24H  finasteride (PROSCAR) tablet, 5 mg, Oral, Daily before Breakfast  pantoprazole (PROTONIX) delayed release tablet, 40 mg, Oral, Daily  tamsulosin (FLOMAX) capsule, 0.8 mg, Oral, Daily        Physical Exam:  General: No acute distress  Cardiac: Regular rate and rhythm, no murmur auscultated.  Respiratory: Clear to auscultation bilaterally , no wheeze, rhonchi  Abdomen:soft, non distended, non-tender, no organomegaly  Neurological: Alert and Orient X 3; left upper and lower extremity power 4-5/5 Extremities: No peripheral edema noted     Results for orders placed or performed during the hospital encounter of 06/03/23 (from the past 24 hour(s))   CBC   Result Value Ref Range    WBC 6.9 3.7 - 11.0 x10^3/uL    RBC 5.09 4.50 - 6.10 x10^6/uL    HGB 14.7 13.4 - 17.5 g/dL    HCT 16.1 09.6 - 04.5 %    MCV 90.8 78.0 - 100.0 fL    MCH 28.9 26.0 - 32.0 pg    MCHC 31.8 31.0 - 35.5 g/dL    RDW-CV 40.9 81.1 - 91.4 %    PLATELETS 174 150 - 400 x10^3/uL    MPV 9.8 8.7 - 12.5 fL   BASIC METABOLIC PANEL    Result Value Ref Range    SODIUM 138 133 - 144 mmol/L    POTASSIUM 4.2 3.2 - 5.0 mmol/L    CHLORIDE 104 96 - 106 mmol/L    CO2 TOTAL 21 (L) 22 - 30 mmol/L    ANION GAP 13 mmol/L    CALCIUM 9.6 8.3 - 10.7 mg/dL    GLUCOSE 782 (H) 74 - 109 mg/dL    BUN 13 8 - 23 mg/dL    CREATININE 9.56 2.13 - 1.20 mg/dL    BUN/CREA RATIO 12     ESTIMATED GFR 72 (L) >90 mL/min/1.3m^2   MAGNESIUM   Result Value Ref Range    MAGNESIUM 1.9 1.6 - 2.4 mg/dL   PHOSPHORUS   Result Value Ref Range    PHOSPHORUS 4.3 2.5 - 4.5 mg/dL         Micro: No results found for any visits on 06/03/23 (from the past 96 hour(s)).    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    Facial weakness due to recent stroke     Worsening left sided weakness-new CVA ruled out    Patient had recent CVA in May  S/p right ICA stenosis s/p stent on 05/31/2023  Patient's A1c 6.9, lipid panel showed LDL 49 and triglyceride 103 on 04/05/2023  MRI brain showed chronic small-vessel ischemic changes cerebral hemispheres and brainstem, no intracranial hemorrhage or intracranial mass, no evidence of acute CVA  Patient was evaluated by Neurology, patient's symptoms likely consistent with recrudescence of old stroke  Continue aspirin, statin and Plavix  Patient was evaluated by Physical therapy, recommended HH PT       Gastroesophageal reflux disease  Continue PPI     CAD   S/p stent greater than 15 years ago  Continue aspirin and statin    Type 2 diabetes mellitus   HGB A1c 6.9 on 04/05/2023  Continue sliding scale insulin       DVT/PE Prophylaxis: Enoxaparin    Disposition-physical therapy recommended HH PT.  Called patient's son to discuss discharge planning but he did not answer.  Patient's daughter's number is not listed, will try to call him again tomorrow.    Conchita Paris, MD

## 2023-06-05 NOTE — Care Plan (Signed)
Problem: Acute Rehab Services Goal & Intervention Plan  Goal: Bathing Goal  Description: Stand Alone Therapy Goal  Outcome: Ongoing (see interventions/notes)     Problem: Acute Rehab Services Goal & Intervention Plan  Goal: Bed Mobility Goal  Description: Stand Alone Therapy Goal  Outcome: Ongoing (see interventions/notes)     Problem: Acute Rehab Services Goal & Intervention Plan  Goal: Cognition Goal  Description: Stand Alone Therapy Goal  Outcome: Ongoing (see interventions/notes)

## 2023-06-06 LAB — BASIC METABOLIC PANEL
ANION GAP: 12 mmol/L
BUN/CREA RATIO: 15
BUN: 16 mg/dL (ref 8–23)
CALCIUM: 9.2 mg/dL (ref 8.3–10.7)
CHLORIDE: 105 mmol/L (ref 96–106)
CO2 TOTAL: 22 mmol/L (ref 22–30)
CREATININE: 1.04 mg/dL (ref 0.70–1.20)
ESTIMATED GFR: 76 mL/min/{1.73_m2} — ABNORMAL LOW (ref >90)
GLUCOSE: 136 mg/dL — ABNORMAL HIGH (ref 74–109)
POTASSIUM: 3.9 mmol/L (ref 3.2–5.0)
SODIUM: 139 mmol/L (ref 133–144)

## 2023-06-06 LAB — CBC
HCT: 43.5 % (ref 38.9–52.0)
HGB: 14.2 g/dL (ref 13.4–17.5)
MCH: 28.7 pg (ref 26.0–32.0)
MCHC: 32.6 g/dL (ref 31.0–35.5)
MCV: 88.1 fL (ref 78.0–100.0)
MPV: 9.8 fL (ref 8.7–12.5)
PLATELETS: 176 10*3/uL (ref 150–400)
RBC: 4.94 10*6/uL (ref 4.50–6.10)
RDW-CV: 15.5 % (ref 11.5–15.5)
WBC: 7.3 10*3/uL (ref 3.7–11.0)

## 2023-06-06 LAB — MAGNESIUM: MAGNESIUM: 1.9 mg/dL (ref 1.6–2.4)

## 2023-06-06 LAB — PHOSPHORUS: PHOSPHORUS: 3.6 mg/dL (ref 2.5–4.5)

## 2023-06-06 NOTE — Care Plan (Signed)
Problem: Acute Rehab Services Goal & Intervention Plan  Goal: Bathing Goal  Description: Stand Alone Therapy Goal  Outcome: Ongoing (see interventions/notes)     Problem: Acute Rehab Services Goal & Intervention Plan  Goal: Cognition Goal  Description: Stand Alone Therapy Goal  Outcome: Ongoing (see interventions/notes)

## 2023-06-06 NOTE — Progress Notes (Signed)
[place] [facility] Progress Note    Derek Knox       72 y.o.       Date of service: 06/06/2023  Date of Admission:  06/03/2023    Hospital Day:  LOS: 0 days   CC: CVA (cerebral vascular accident) (CMS HCC)    Subjective:  Patient was seen and examined at bedside this morning.  He denied any new complaints.  Called patient's daughter Derek Knox  who is patient's MPOA at [phone] to discuss discharge planning, she did not answer phone.      Objective:   Filed Vitals:    06/05/23 2232 06/06/23 0313 06/06/23 0807 06/06/23 1225   BP: 113/70 122/71 120/76 117/72   Pulse: 77 81 76 71   Resp: 18 18 17 16    Temp: 36.9 C (98.4 F) 36.5 C (97.7 F) 36.6 C (97.9 F) 36.9 C (98.4 F)   SpO2: 96% 97% 97% 97%     Oxygen Device  SpO2: 97 %    aspirin chewable tablet 81 mg, 81 mg, Oral, Daily  atorvastatin (LIPITOR) tablet, 80 mg, Oral, QPM  clopidogrel (PLAVIX) 75 mg tablet, 75 mg, Oral, Daily  enoxaparin PF (LOVENOX) 40 mg/0.4 mL SubQ injection, 40 mg, Subcutaneous, Q24H  finasteride (PROSCAR) tablet, 5 mg, Oral, Daily before Breakfast  pantoprazole (PROTONIX) delayed release tablet, 40 mg, Oral, Daily  tamsulosin (FLOMAX) capsule, 0.8 mg, Oral, Daily        Physical Exam:  General: No acute distress  Cardiac: Regular rate and rhythm, no murmur auscultated.  Respiratory: Clear to auscultation bilaterally , no wheeze, rhonchi  Abdomen:soft, non distended, non-tender, no organomegaly  Neurological: Alert and Orient X 3 with intermittent confusion, left upper and lower extremity power 4-5/5 Extremities: No peripheral edema noted     Results for orders placed or performed during the hospital encounter of 06/03/23 (from the past 24 hour(s))   CBC   Result Value Ref Range    WBC 7.3 3.7 - 11.0 x10^3/uL    RBC 4.94 4.50 - 6.10 x10^6/uL    HGB 14.2 13.4 - 17.5 g/dL    HCT 29.5 62.1 - 30.8 %    MCV 88.1 78.0 - 100.0 fL    MCH 28.7 26.0 - 32.0 pg    MCHC 32.6 31.0 - 35.5 g/dL    RDW-CV 65.7 84.6 - 96.2 %     PLATELETS 176 150 - 400 x10^3/uL    MPV 9.8 8.7 - 12.5 fL   BASIC METABOLIC PANEL   Result Value Ref Range    SODIUM 139 133 - 144 mmol/L    POTASSIUM 3.9 3.2 - 5.0 mmol/L    CHLORIDE 105 96 - 106 mmol/L    CO2 TOTAL 22 22 - 30 mmol/L    ANION GAP 12 mmol/L    CALCIUM 9.2 8.3 - 10.7 mg/dL    GLUCOSE 952 (H) 74 - 109 mg/dL    BUN 16 8 - 23 mg/dL    CREATININE 8.41 3.24 - 1.20 mg/dL    BUN/CREA RATIO 15     ESTIMATED GFR 76 (L) >90 mL/min/1.32m^2   MAGNESIUM   Result Value Ref Range    MAGNESIUM 1.9 1.6 - 2.4 mg/dL   PHOSPHORUS   Result Value Ref Range    PHOSPHORUS 3.6 2.5 - 4.5 mg/dL         Micro: No results found for any visits on 06/03/23 (from the past 96 hour(s)).    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    Facial weakness due to recent stroke     Worsening left sided weakness-new CVA ruled out    Patient had recent CVA in May  S/p right ICA stenosis s/p stent on 05/31/2023  Patient's A1c 6.9, lipid panel showed LDL 49 and triglyceride 103 on 04/05/2023  MRI brain showed chronic small-vessel ischemic changes cerebral hemispheres and brainstem, no intracranial hemorrhage or intracranial mass, no evidence of acute CVA  Patient was evaluated by Neurology, patient's symptoms likely consistent with recrudescence of old stroke  Continue aspirin, statin and Plavix  Patient was evaluated by Physical therapy, recommended HHPT       Gastroesophageal reflux disease  Continue PPI     CAD   S/p stent greater than 15 years ago  Continue aspirin and statin    Type 2 diabetes mellitus   HGB A1c 6.9 on 04/05/2023  Continue sliding scale insulin       DVT/PE Prophylaxis: Enoxaparin    Disposition-physical therapy recommended HHPT, Called patient's daughter Derek Knox  who is patient's MPOA at [phone] to discuss discharge planning, she did not answer  phone.    Conchita Paris, MD

## 2023-06-06 NOTE — Care Plan (Signed)
Problem: Acute Rehab Services Goal & Intervention Plan  Goal: Bathing Goal  Description: Stand Alone Therapy Goal  Outcome: Ongoing (see interventions/notes)  Goal: Bed Mobility Goal  Description: Stand Alone Therapy Goal  Outcome: Ongoing (see interventions/notes)  Goal: Caregiver Training Goal  Description: Stand Alone Therapy Goal  Outcome: Ongoing (see interventions/notes)  Goal: Cognition Goal  Description: Stand Alone Therapy Goal  Outcome: Ongoing (see interventions/notes)  Goal: Cognition Goals, SLP  Description: Stand Alone Therapy Goal  Outcome: Ongoing (see interventions/notes)  Goal: Communication Goals, SLP  Description: Stand Alone Therapy Goal  Outcome: Ongoing (see interventions/notes)  Goal: Dysphagia Goals, SLP  Description: Stand Alone Therapy Goal  Outcome: Ongoing (see interventions/notes)  Goal: Eating Self-Feeding Goal  Description: Stand Alone Therapy Goal  Outcome: Ongoing (see interventions/notes)  Goal: Gait Training Goal  Description: Stand Alone Therapy Goal  Outcome: Ongoing (see interventions/notes)  Goal: Grooming Goal  Description: Stand Alone Therapy Goal  Outcome: Ongoing (see interventions/notes)  Goal: Home Management Goal  Description: Stand Alone Therapy Goal  Outcome: Ongoing (see interventions/notes)  Goal: Interprofessional Goal  Description: Stand Alone Therapy Goal  Outcome: Ongoing (see interventions/notes)  Goal: LB Dressing Goal  Description: Stand Alone Therapy Goal  Outcome: Ongoing (see interventions/notes)  Goal: Occupational Therapy Goals  Description: Stand Alone Therapy Goal  Outcome: Ongoing (see interventions/notes)  Goal: Physical Therapy Goal  Description: Stand Alone Therapy Goal  Outcome: Ongoing (see interventions/notes)  Goal: Range of Motion Goal  Description: Stand Alone Therapy Goal  Outcome: Ongoing (see interventions/notes)  Goal: Strength Goal  Description: Stand Alone Therapy Goal  Outcome: Ongoing (see interventions/notes)  Goal: Toileting  Goal  Description: Stand Alone Therapy Goal  Outcome: Ongoing (see interventions/notes)  Goal: Goal Transfer Training  Description: Stand Alone Therapy Goal  Outcome: Ongoing (see interventions/notes)  Goal: UB Dressing Goal  Description: Stand Alone Therapy Goal  Outcome: Ongoing (see interventions/notes)     Problem: Health Knowledge, Opportunity to Enhance (Adult,Obstetrics,Pediatric)  Goal: Knowledgeable about Health Subject/Topic  Description: Patient will demonstrate the desired outcomes by discharge/transition of care.  Outcome: Ongoing (see interventions/notes)     Problem: Fall Injury Risk  Goal: Absence of Fall and Fall-Related Injury  Outcome: Ongoing (see interventions/notes)

## 2023-06-07 NOTE — Progress Notes (Signed)
[place] [facility] Progress Note    Derek Knox       72 y.o.       Date of service: 06/07/2023  Date of Admission:  06/03/2023    Hospital Day:  LOS: 0 days   CC: CVA (cerebral vascular accident) (CMS HCC)    Subjective:  Patient was seen and examined at bedside this morning.  He denied any new complaints.  Called patient's daughter Derek Knox  who is patient's MPOA at [phone] and discussed about discharge planning, she wants patient to go to [facility].      Objective:   Filed Vitals:    06/06/23 2329 06/07/23 0338 06/07/23 0806 06/07/23 1200   BP: 118/69 126/77 129/69 127/67   Pulse: 78 89 79 71   Resp: 16 17 16 16    Temp: 36.8 C (98.2 F) 36.7 C (98.1 F) 36.6 C (97.9 F) 36.4 C (97.5 F)   SpO2: 95% 95% 96% 96%     Oxygen Device  SpO2: 96 %    aspirin chewable tablet 81 mg, 81 mg, Oral, Daily  atorvastatin (LIPITOR) tablet, 80 mg, Oral, QPM  clopidogrel (PLAVIX) 75 mg tablet, 75 mg, Oral, Daily  enoxaparin PF (LOVENOX) 40 mg/0.4 mL SubQ injection, 40 mg, Subcutaneous, Q24H  finasteride (PROSCAR) tablet, 5 mg, Oral, Daily before Breakfast  pantoprazole (PROTONIX) delayed release tablet, 40 mg, Oral, Daily  tamsulosin (FLOMAX) capsule, 0.8 mg, Oral, Daily        Physical Exam:  General: No acute distress  Cardiac: Regular rate and rhythm, no murmur auscultated.  Respiratory: Clear to auscultation bilaterally , no wheeze, rhonchi  Abdomen:soft, non distended, non-tender, no organomegaly  Neurological: Alert and Orient X 3 with intermittent confusion, 5/5 in all extremity : No peripheral edema noted     No results found for this or any previous visit (from the past 24 hour(s)).        Micro: No results found for any visits on 06/03/23 (from the past 96 hour(s)).    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    Facial  weakness due to recent stroke     Worsening left sided weakness-new CVA ruled out    Patient had recent CVA in May  S/p right ICA stenosis s/p stent on 05/31/2023  Patient's A1c 6.9, lipid panel showed LDL 49 and triglyceride 103 on 04/05/2023  MRI brain showed chronic small-vessel ischemic changes cerebral hemispheres and brainstem, no intracranial hemorrhage or intracranial mass, no evidence of acute CVA  Patient was evaluated by Neurology, patient's symptoms likely consistent with recrudescence of old stroke  Continue aspirin, statin and Plavix  Patient was evaluated by Physical therapy, recommended HHPT       Gastroesophageal reflux disease  Continue PPI     CAD   S/p stent greater than 15 years ago  Continue aspirin and statin    Type 2 diabetes mellitus   HGB A1c 6.9 on 04/05/2023  Continue sliding scale insulin       DVT/PE Prophylaxis: Enoxaparin    Disposition-physical therapy recommended HHPT, Called patient's daughter Derek Knox  who is patient's MPOA at [phone] requested for [facility].  Social worker informed insurance denied for [facility], called insurance to do peer to peer was put on  hold, will try to call them again later.    Conchita Paris, MD

## 2023-06-07 NOTE — Care Plan (Signed)
Problem: Acute Rehab Services Goal & Intervention Plan  Goal: Bathing Goal  Description: Stand Alone Therapy Goal  Outcome: Ongoing (see interventions/notes)  Goal: Bed Mobility Goal  Description: Stand Alone Therapy Goal  Outcome: Ongoing (see interventions/notes)  Goal: Caregiver Training Goal  Description: Stand Alone Therapy Goal  Outcome: Ongoing (see interventions/notes)  Goal: Cognition Goal  Description: Stand Alone Therapy Goal  Outcome: Ongoing (see interventions/notes)  Goal: Cognition Goals, SLP  Description: Stand Alone Therapy Goal  Outcome: Ongoing (see interventions/notes)  Goal: Communication Goals, SLP  Description: Stand Alone Therapy Goal  Outcome: Ongoing (see interventions/notes)  Goal: Dysphagia Goals, SLP  Description: Stand Alone Therapy Goal  Outcome: Ongoing (see interventions/notes)  Goal: Eating Self-Feeding Goal  Description: Stand Alone Therapy Goal  Outcome: Ongoing (see interventions/notes)  Goal: Gait Training Goal  Description: Stand Alone Therapy Goal  Outcome: Ongoing (see interventions/notes)  Goal: Grooming Goal  Description: Stand Alone Therapy Goal  Outcome: Ongoing (see interventions/notes)  Goal: Home Management Goal  Description: Stand Alone Therapy Goal  Outcome: Ongoing (see interventions/notes)  Goal: Interprofessional Goal  Description: Stand Alone Therapy Goal  Outcome: Ongoing (see interventions/notes)  Goal: LB Dressing Goal  Description: Stand Alone Therapy Goal  Outcome: Ongoing (see interventions/notes)  Goal: Occupational Therapy Goals  Description: Stand Alone Therapy Goal  Outcome: Ongoing (see interventions/notes)  Goal: Physical Therapy Goal  Description: Stand Alone Therapy Goal  Outcome: Ongoing (see interventions/notes)  Goal: Range of Motion Goal  Description: Stand Alone Therapy Goal  Outcome: Ongoing (see interventions/notes)  Goal: Strength Goal  Description: Stand Alone Therapy Goal  Outcome: Ongoing (see interventions/notes)  Goal: Toileting  Goal  Description: Stand Alone Therapy Goal  Outcome: Ongoing (see interventions/notes)  Goal: Goal Transfer Training  Description: Stand Alone Therapy Goal  Outcome: Ongoing (see interventions/notes)  Goal: UB Dressing Goal  Description: Stand Alone Therapy Goal  Outcome: Ongoing (see interventions/notes)     Problem: Health Knowledge, Opportunity to Enhance (Adult,Obstetrics,Pediatric)  Goal: Knowledgeable about Health Subject/Topic  Description: Patient will demonstrate the desired outcomes by discharge/transition of care.  Outcome: Ongoing (see interventions/notes)  Intervention: Enhance Health Knowledge  Recent Flowsheet Documentation  Taken 06/07/2023 0800 by Wendy Poet, RN  Family/Support System Care: self-care encouraged     Problem: Fall Injury Risk  Goal: Absence of Fall and Fall-Related Injury  Outcome: Ongoing (see interventions/notes)  Intervention: Identify and Manage Contributors  Recent Flowsheet Documentation  Taken 06/07/2023 0800 by Wendy Poet, RN  Medication Review/Management: medications reviewed  Intervention: Promote Injury-Free Environment  Recent Flowsheet Documentation  Taken 06/07/2023 0800 by Wendy Poet, RN  Safety Promotion/Fall Prevention:   activity supervised   nonskid shoes/slippers when out of bed

## 2023-06-07 NOTE — Care Management Notes (Signed)
06/07/23 1603   Assessment Details   Assessment Type Admission   Date of Care Management Update 06/07/23   Readmission   Is this a readmission? No   Is this a scheduled readmission? No   Insurance Information/Type   Insurance type Medicare   Employment/Financial   Patient has Prescription Coverage?  Yes   Financial Concerns none   Living Environment   Select an age group to open "lives with" row.  Adult   Lives With child(ren), adult   Living Arrangements house   Able to Return to Prior Arrangements no   Home Safety   Home Assessment: No Problems Identified   Home Accessibility no concerns   Custody and Legal Status   Do you have a court appointed guardian/conservator? No   Are you an emancipated minor? No   Custody Issues? No   Paternity Affidavit Requested? No   Care Management Plan   Discharge Planning Status initial meeting   CM will evaluate for rehabilitation potential yes   Patient choice offered to patient/family yes   Form for patient choice reviewed/signed and on chart yes   Facility or Agency Preferences [facility]   Discharge Needs Assessment   Outpatient/Agency/Support Group Needs inpatient rehabilitation facility   Discharge Facility/Level of Care Needs Acute Rehab Placement/Return (not psych)(code 62)   Transportation Available ambulance   Referral Information   Admission Type observation   Address Verified verified-no changes   Arrived From home or self-care   ADVANCE DIRECTIVES   Does the Patient have an Advance Directive? Yes, Patient Does Have Advance Directive for Healthcare Treatment   Type of Advance Directive Completed Medical Power of Attorney   Name of MPOA or Healthcare Surrogate [name] - daughtee   Phone Number of MPOA or Healthcare Surrogate [phone]     This RN CM met with patient to determine discharge plan and complete CM assessment.  Pt's daughter [name] requesting patient return to [facility] - was there previously.  Auth for [facility] had been submitted by care management and was  denied by [facility].  Dr Lennie Hummer notified that peer to peer is option and is due by tomorrow 06/08/23 1130am. This RN CM spoke to daughter Sharen Heck (HCS/MPOA) and she will await peer to peer determination - does not wish to make further referrals for other SNF at this time.  Dr Lennie Hummer had determined patient to be incapacitated and Sharen Heck appointed as [facility] - form placed on chart. She stated if [facility] totally denied she may opt for him to return home with [facility].  Will follow up and  continue to monitor.

## 2023-06-07 NOTE — Care Plan (Signed)
Problem: Acute Rehab Services Goal & Intervention Plan  Goal: Bathing Goal  Description: Stand Alone Therapy Goal  Outcome: Ongoing (see interventions/notes)     Problem: Acute Rehab Services Goal & Intervention Plan  Goal: Cognition Goal  Description: Stand Alone Therapy Goal  Outcome: Ongoing (see interventions/notes)

## 2023-06-07 NOTE — Care Plan (Signed)
[facility]   [address]  [place], [address]  (Office) [phone]  Rehabilitation Department      Physical Therapy Daily Inpatient Note    Date: 06/07/2023  Patient's Name: Derek Knox  Date of Birth: 08/30/1951  Height: Height: 175.3 cm (5\' 9" )  Weight: Weight: 72.6 kg (160 lb)      Chart Review Performed:  Yes    Equipment:  Straight Cane  Weight Bearing Status:  No Restrictions  Precautions:   Fall Risk    Post Acute Care Recommendations:  HHPT    Plan: Will continue under current POC.   Frequency: Patient will be seen once daily 3-5 times per week    Subjective/Objective/Assessment:    Pain Level: 0/10    Flowsheet    06/07/23 1050   Rehab Session   Document Type therapy progress note (daily note)   PT Visit Date 06/07/23   Total PT Minutes: 15   General Information   Patient Profile Reviewed yes   Pre Treatment Status   Pre Treatment Patient Status Patient supine in bed   Communication Pre Treatment  Nurse   Bed Mobility Assessment/Treatment   Supine-Sit Independence stand-by assistance   Sit to Supine, Independence stand-by assistance   Transfer Assessment/Treatment   Sit-Stand [place] contact guard assist   Stand-Sit [place] contact guard assist   Sit-Stand-Sit, Assist Device straight cane   Transfer Comment Cues for hand placement with transfers   Gait Assessment/Treatment   Independence  contact guard assist   Assistive Device  straight cane   Distance in Feet ~150' x 1   Comment Gait fairly steady, no LOB noted.  Cues for direction and safety   Post Treatment Status   Post Treatment Patient Status Patient supine in bed   Communication Post Treatement Nurse   Plan of Care Review   Plan Of Care Reviewed With patient   Physical Therapy Clinical Impression   Assessment Patient tolerated treatment and with good participation   Therapy Frequency minimum of 3x/week   Predicted Duration of Therapy Intervention (days/wks) until goals are met         Plan  discussed with Supervising PT: Yes    Intervention minutes: GAIT TRAINING 15 MINUTES    Nursing Notified: Yes  Patient Left:  Back to Bed    THERAPIST  Gust Brooms, PTA  06/07/2023, 11:45

## 2023-06-08 MED ORDER — CLOPIDOGREL 75 MG TABLET
75.0000 mg | ORAL_TABLET | Freq: Every day | ORAL | 0 refills | Status: DC
Start: 2023-06-09 — End: 2023-10-23

## 2023-06-08 MED ORDER — ATORVASTATIN 40 MG TABLET
40.0000 mg | ORAL_TABLET | Freq: Every evening | ORAL | 0 refills | Status: AC
Start: 2023-06-08 — End: 2023-10-23

## 2023-06-08 NOTE — Care Management Notes (Signed)
This RN CM spoke to patient's daughter [name] who states they will take patient home and care for  him per family.  Declines HH or facility placement.  [facility] liaison notified. No needs identified.  Dr Lennie Hummer to discharge patient home.  Please contact care management if any newly identified discharge needs.

## 2023-06-08 NOTE — Discharge Summary (Signed)
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        Details   atorvastatin 40 mg Tablet  Commonly known as: LIPITOR   40 mg, Oral, EVERY EVENING  Qty: 30 Tablet  Refills: 0     clopidogreL 75 mg Tablet  Commonly known as: PLAVIX  Start taking on: June 09, 2023   75 mg, Oral, DAILY  Qty: 30 Tablet  Refills: 0            CONTINUE these medications - NO CHANGES were made during your visit.        Details   Aricept 10 mg Tablet  Generic drug: donepeziL   10 mg, Oral, NIGHTLY  Refills: 0     aspirin 325 mg Tablet   325 mg, Oral, DAILY  Refills: 0     Breztri Aerosphere 160-9-4.8 mcg/actuation HFA Aerosol Inhaler  Generic drug: budesonide-glycopyr-formoterol   2 Puffs, Inhalation, 2 TIMES DAILY  Refills: 0     clobetasoL 0.05 % Cream  Commonly known as: TEMOVATE   Apply Topically, 2 TIMES DAILY  Refills: 0     dutasteride 0.5 mg Capsule  Commonly known as: AVODART   0.5 mg, Oral, DAILY  Refills: 0     glimepiride 4 mg Tablet  Commonly known as: AMARYL   4 mg, Oral, EVERY MORNING WITH BREAKFAST  Refills: 0     pantoprazole 40 mg Tablet, Delayed Release (E.C.)  Commonly known as: PROTONIX   40 mg, Oral, DAILY  Refills: 0     pioglitazone 45 mg Tablet  Commonly known as: ACTOS   45 mg, Oral, DAILY  Refills: 0     Spiriva Respimat 2.5 mcg/actuation oral inhaler  Generic drug: tiotropium bromide   2 Puffs, Inhalation, DAILY  Refills: 0     tamsulosin 0.4 mg Capsule  Commonly known as:  FLOMAX   0.8 mg, Oral, DAILY  Refills: 0              DISCHARGE INSTRUCTIONS:   No discharge procedures on file.   Follow-up Information       [place], Meghan, MD. Go on 07/05/2023.    Specialty: VASCULAR SURGERY  Why: 1PM Please call if you need to reschedule  Contact information:  [address]  [place] [address]  [phone]                               REASON FOR HOSPITALIZATION AND HOSPITAL COURSE:    HPI  AMIL BOUWMAN is a 72 y.o. male with a PMH of HTN, R carotid stenosis s/p stent 05/31/23 ([facility]), CAD s/p stent, type 2 DM,GERD who  presented to [facility] ED on 06/03/2023 due to worsening of left-sided weakness.    Patient's daughter who is MPOA is present at bedside and was the primary historian.  She reports that patient had a stroke back in May 2024 at which time was discovered he had a right-sided carotid stenosis.  Patient underwent stenting with IR at [facility] on 05/31/2023.  Daughter reports that the patient woke up groggy with some slurred speech but not significantly different from before the procedure.  She reports Tuesday the patient was still groggy and that she was told it was due to the medications he was being given.  She reports Tuesday night he was delirious and had become combative so he was given additional medications and that Wednesday morning he had worsening of left-sided weakness as well as slurring of speech.  She reports that the patient has had some left-sided weakness and slurred speech as well as confusion since a CVA from May of 2024.  She reports that there is somebody always living with him as he is not safe to live by himself and there was a concern for dementia though he has not been formally tested.  She endorses patient was cleared later that day to be discharged home despite worsening of symptoms.  Review of records showed the patient patient had become hypotensive and bradycardic requiring Levophed and IV fluids.  underwent a CT head  there that was negative and CTA abdomen pelvis which was negative for source of bleed at that time they stopped his donepezil on atenolol.  Patient's daughter reports symptoms failed to improve regarding the left-sided worsened weakness and slurring of speech so she brought the patient to our ED instead.  In ED chronic appearing right lacunar infarct which is evolved and atrophy of chronic microvascular ischemic disease.  CTA intra and extracranial showed a patent right internal carotid artery stent and no significant carotid or vertebral artery stenosis.  Also noted overall decreased small-vessel visualization of right frontal lobe compared to left suggestive of distal branch occlusions.  Right posterior cerebral artery stenosis was also noted.  Patient was outside of the window for thrombolytics and was not a candidate for intervention though ED discussed with vascular surgery to confirm.    Hospital course  The patient with above mentioned HPI was admitted for concern for new CVA with known history of CVA.  He was started on aspirin, Plavix and statin.  Neurology was consulted.  MRI brain was done which showed chronic small-vessel ischemic changes cerebral hemispheres and brainstem, no intracranial hemorrhage or intracranial mass, no evidence of acute CVA. Per Neurology patient's symptoms likely consistent with recrudescence of old stroke, recommended to avoid extremes of blood pressure especially hypotension and recommended to continue dual antiplatelet therapy with aspirin and Plavix.  Patient's A1c 6.9, lipid panel showed LDL 49 and triglyceride 103 on 04/05/2023 .    He was evaluated by Physical therapy, recommended HHPT.  Per patient's daughter/[facility] request [facility] but insurance denied.  [facility] agreed to take patient home, declined home health.  He is medically stable for discharge home with family.    DISCHARGE DAY ENCOUNTER SERVICES:  Patient was seen and examined on the day  of discharge.  Approximately 29 minutes were spent on discharge services on the day of discharge.      Copies sent to Care Team         Relationship Specialty Notifications Start End    Jen Mow, [place]  PCP - General INTERNAL MEDICINE Admissions 06/03/23     Phone: [phone] Fax: [phone]         [address] [place] [address]              Conchita Paris, MD

## 2023-06-08 NOTE — Care Management Notes (Signed)
PT wasn't able to sign MOON ltr, not certain he understood ltr, left copy of MOON ltr in the room, called Elder Negus @ [phone] and she understood MOON ltr

## 2023-06-08 NOTE — Care Plan (Signed)
Problem: Acute Rehab Services Goal & Intervention Plan  Goal: Bathing Goal  Description: Stand Alone Therapy Goal  Outcome: Ongoing (see interventions/notes)  Goal: Bed Mobility Goal  Description: Stand Alone Therapy Goal  Outcome: Ongoing (see interventions/notes)  Goal: Caregiver Training Goal  Description: Stand Alone Therapy Goal  Outcome: Ongoing (see interventions/notes)  Goal: Cognition Goal  Description: Stand Alone Therapy Goal  Outcome: Ongoing (see interventions/notes)  Goal: Cognition Goals, SLP  Description: Stand Alone Therapy Goal  Outcome: Ongoing (see interventions/notes)  Goal: Communication Goals, SLP  Description: Stand Alone Therapy Goal  Outcome: Ongoing (see interventions/notes)  Goal: Dysphagia Goals, SLP  Description: Stand Alone Therapy Goal  Outcome: Ongoing (see interventions/notes)  Goal: Eating Self-Feeding Goal  Description: Stand Alone Therapy Goal  Outcome: Ongoing (see interventions/notes)  Goal: Gait Training Goal  Description: Stand Alone Therapy Goal  Outcome: Ongoing (see interventions/notes)  Goal: Grooming Goal  Description: Stand Alone Therapy Goal  Outcome: Ongoing (see interventions/notes)  Goal: Home Management Goal  Description: Stand Alone Therapy Goal  Outcome: Ongoing (see interventions/notes)  Goal: Interprofessional Goal  Description: Stand Alone Therapy Goal  Outcome: Ongoing (see interventions/notes)  Goal: LB Dressing Goal  Description: Stand Alone Therapy Goal  Outcome: Ongoing (see interventions/notes)  Goal: Occupational Therapy Goals  Description: Stand Alone Therapy Goal  Outcome: Ongoing (see interventions/notes)  Goal: Physical Therapy Goal  Description: Stand Alone Therapy Goal  Outcome: Ongoing (see interventions/notes)  Goal: Range of Motion Goal  Description: Stand Alone Therapy Goal  Outcome: Ongoing (see interventions/notes)  Goal: Strength Goal  Description: Stand Alone Therapy Goal  Outcome: Ongoing (see interventions/notes)  Goal: Toileting  Goal  Description: Stand Alone Therapy Goal  Outcome: Ongoing (see interventions/notes)  Goal: Goal Transfer Training  Description: Stand Alone Therapy Goal  Outcome: Ongoing (see interventions/notes)  Goal: UB Dressing Goal  Description: Stand Alone Therapy Goal  Outcome: Ongoing (see interventions/notes)     Problem: Health Knowledge, Opportunity to Enhance (Adult,Obstetrics,Pediatric)  Goal: Knowledgeable about Health Subject/Topic  Description: Patient will demonstrate the desired outcomes by discharge/transition of care.  Outcome: Ongoing (see interventions/notes)  Intervention: Enhance Health Knowledge  Recent Flowsheet Documentation  Taken 06/08/2023 0800 by Wendy Poet, RN  Family/Support System Care: self-care encouraged     Problem: Fall Injury Risk  Goal: Absence of Fall and Fall-Related Injury  Outcome: Ongoing (see interventions/notes)  Intervention: Identify and Manage Contributors  Recent Flowsheet Documentation  Taken 06/08/2023 0800 by Wendy Poet, RN  Medication Review/Management: medications reviewed     Problem: Skin Injury Risk Increased  Goal: Skin Health and Integrity  Outcome: Ongoing (see interventions/notes)  Intervention: Optimize Skin Protection  Recent Flowsheet Documentation  Taken 06/08/2023 0800 by Wendy Poet, RN  Activity Management: bedrest  Head of Bed (HOB) Positioning: HOB at 20-30 degrees

## 2023-06-08 NOTE — Care Plan (Signed)
[facility]   [address]  [place], [address]  (Office) [phone]  Rehabilitation Department      Physical Therapy Daily Inpatient Note    Date: 06/08/2023  Patient's Name: Derek Knox  Date of Birth: 1951/04/08  Height: Height: 175.3 cm (5\' 9" )  Weight: Weight: 72.6 kg (160 lb)      Chart Review Performed:  Yes    Equipment:  Straight Cane  Weight Bearing Status:  No Restrictions  Precautions:  none    Post Acute Care Recommendations:  HHPT    Plan: Will continue under current POC.   Frequency: Patient will be seen once daily 3-5 times per week    Subjective/Objective/Assessment:    Pain Level: 0/10    Flowsheet    06/08/23 1120   Rehab Session   Document Type therapy progress note (daily note)   PT Visit Date 06/08/23   Total PT Minutes: 15   General Information   Patient Profile Reviewed yes   Pre Treatment Status   Pre Treatment Patient Status Patient supine in bed   Communication Pre Treatment  Nurse   Bed Mobility Assessment/Treatment   Supine-Sit Independence stand-by assistance   Sit to Supine, Independence stand-by assistance   Transfer Assessment/Treatment   Sit-Stand Independence contact guard assist   Stand-Sit Independence contact guard assist   Sit-Stand-Sit, Assist Device straight cane   Transfer Comment Cues for hand placement with transfers   Gait Assessment/Treatment   Independence  contact guard assist   Assistive Device  straight cane   Distance in Feet ~150' x 1   Comment Gair fairly steady, no LOB noted.   Post Treatment Status   Post Treatment Patient Status Patient supine in bed   Communication Post Treatement Nurse   Plan of Care Review   Plan Of Care Reviewed With patient   Physical Therapy Clinical Impression   Assessment Patient tolerated treatment and with good participation   Therapy Frequency minimum of 3x/week   Predicted Duration of Therapy Intervention (days/wks) until goals are met         Plan discussed with Supervising PT:  Yes    Intervention minutes: GAIT TRAINING 15 MINUTES    Nursing Notified: Yes  Patient Left:  Back to Bed    THERAPIST  Gust Brooms, PTA  06/08/2023, 13:00

## 2023-07-05 ENCOUNTER — Encounter (HOSPITAL_BASED_OUTPATIENT_CLINIC_OR_DEPARTMENT_OTHER): Payer: Self-pay | Admitting: Student in an Organized Health Care Education/Training Program

## 2023-09-23 ENCOUNTER — Other Ambulatory Visit: Payer: Self-pay

## 2023-09-23 ENCOUNTER — Ambulatory Visit: Payer: Medicare Other | Attending: NURSE PRACTITIONER

## 2023-09-23 DIAGNOSIS — E669 Obesity, unspecified: Secondary | ICD-10-CM | POA: Insufficient documentation

## 2023-09-23 DIAGNOSIS — Z0000 Encounter for general adult medical examination without abnormal findings: Secondary | ICD-10-CM | POA: Insufficient documentation

## 2023-09-23 DIAGNOSIS — I10 Essential (primary) hypertension: Secondary | ICD-10-CM | POA: Insufficient documentation

## 2023-09-23 DIAGNOSIS — J449 Chronic obstructive pulmonary disease, unspecified: Secondary | ICD-10-CM | POA: Insufficient documentation

## 2023-09-23 DIAGNOSIS — R5383 Other fatigue: Secondary | ICD-10-CM | POA: Insufficient documentation

## 2023-09-23 DIAGNOSIS — Z79899 Other long term (current) drug therapy: Secondary | ICD-10-CM | POA: Insufficient documentation

## 2023-09-23 DIAGNOSIS — E785 Hyperlipidemia, unspecified: Secondary | ICD-10-CM | POA: Insufficient documentation

## 2023-09-23 DIAGNOSIS — Z125 Encounter for screening for malignant neoplasm of prostate: Secondary | ICD-10-CM | POA: Insufficient documentation

## 2023-09-23 DIAGNOSIS — E119 Type 2 diabetes mellitus without complications: Secondary | ICD-10-CM | POA: Insufficient documentation

## 2023-09-23 DIAGNOSIS — I519 Heart disease, unspecified: Secondary | ICD-10-CM | POA: Insufficient documentation

## 2023-09-23 DIAGNOSIS — Z8673 Personal history of transient ischemic attack (TIA), and cerebral infarction without residual deficits: Secondary | ICD-10-CM | POA: Insufficient documentation

## 2023-09-23 LAB — URINALYSIS, MACRO/MICRO
BILIRUBIN: NEGATIVE mg/dL
GLUCOSE: >=1000 mg/dL — AB
LEUKOCYTES: NEGATIVE WBCs/uL
NITRITE: NEGATIVE
PH: 6 (ref 4.6–8.0)
PROTEIN: NEGATIVE mg/dL
SPECIFIC GRAVITY: 1.02 (ref 1.003–1.035)
UROBILINOGEN: 0.2 mg/dL (ref 0.2–1.0)

## 2023-09-23 LAB — URINALYSIS, MICROSCOPIC

## 2023-09-23 LAB — MICROALBUMIN/CREATININE RATIO, URINE, RANDOM
CREATININE RANDOM URINE: 112 mg/dL
MICROALBUMIN RANDOM URINE: 3.8 mg/dL
MICROALBUMIN/CREATININE RATIO RANDOM URINE: 33.9 mg/g — ABNORMAL HIGH (ref <30.0)

## 2023-10-23 ENCOUNTER — Emergency Department
Admission: EM | Admit: 2023-10-23 | Discharge: 2023-10-23 | Discharge status: Against Medical Advice | Payer: Medicare (Managed Care) | Attending: Emergency Medicine | Admitting: Emergency Medicine

## 2023-10-23 ENCOUNTER — Encounter (HOSPITAL_COMMUNITY): Payer: Self-pay

## 2023-10-23 ENCOUNTER — Other Ambulatory Visit: Payer: Self-pay

## 2023-10-23 DIAGNOSIS — R739 Hyperglycemia, unspecified: Secondary | ICD-10-CM

## 2023-10-23 DIAGNOSIS — E1165 Type 2 diabetes mellitus with hyperglycemia: Secondary | ICD-10-CM | POA: Insufficient documentation

## 2023-10-23 HISTORY — DX: Cerebral infarction, unspecified (CMS HCC): I63.9

## 2023-10-23 LAB — CBC WITH DIFF
BASOPHIL #: <0.1 10*3/uL (ref <=0.20)
BASOPHIL %: 0.7 %
EOSINOPHIL #: <0.1 10*3/uL (ref <=0.50)
EOSINOPHIL %: 1 %
HCT: 49.2 % (ref 38.9–52.0)
HGB: 16.2 g/dL (ref 13.4–17.5)
IMMATURE GRANULOCYTE #: <0.1 10*3/uL (ref <0.10)
IMMATURE GRANULOCYTE %: 0.2 % (ref 0.0–1.0)
LYMPHOCYTE #: 1.38 10*3/uL (ref 1.00–4.80)
LYMPHOCYTE %: 24.1 %
MCH: 29.3 pg (ref 26.0–32.0)
MCHC: 32.9 g/dL (ref 31.0–35.5)
MCV: 89 fL (ref 78.0–100.0)
MONOCYTE #: 0.48 10*3/uL (ref 0.20–1.10)
MONOCYTE %: 8.4 %
MPV: 10.3 fL (ref 8.7–12.5)
NEUTROPHIL #: 3.76 10*3/uL (ref 1.50–7.70)
NEUTROPHIL %: 65.6 %
PLATELETS: 155 10*3/uL (ref 150–400)
RBC: 5.53 10*6/uL (ref 4.50–6.10)
RDW-CV: 16 % — ABNORMAL HIGH (ref 11.5–15.5)
WBC: 5.7 10*3/uL (ref 3.7–11.0)

## 2023-10-23 LAB — BASIC METABOLIC PANEL
ANION GAP: 10 mmol/L (ref 4–13)
BUN/CREA RATIO: 12 (ref 6–22)
BUN: 13 mg/dL (ref 8–25)
CALCIUM: 9.7 mg/dL (ref 8.6–10.3)
CHLORIDE: 107 mmol/L (ref 96–111)
CO2 TOTAL: 23 mmol/L (ref 23–31)
CREATININE: 1.07 mg/dL (ref 0.75–1.35)
ESTIMATED GFR - MALE: 73 mL/min/BSA (ref >=60)
GLUCOSE: 128 mg/dL — ABNORMAL HIGH (ref 65–125)
POTASSIUM: 4.4 mmol/L (ref 3.5–5.1)
SODIUM: 140 mmol/L (ref 136–145)

## 2023-10-23 LAB — HGA1C (HEMOGLOBIN A1C WITH EST AVG GLUCOSE)
ESTIMATED AVERAGE GLUCOSE: 151 mg/dL
HEMOGLOBIN A1C: 6.9 % — ABNORMAL HIGH (ref <=5.7)

## 2023-10-23 LAB — GOLD TOP TUBE

## 2023-10-23 LAB — POC BLOOD GLUCOSE (RESULTS): GLUCOSE, POC: 119 mg/dL — ABNORMAL HIGH (ref 60–110)

## 2023-10-23 LAB — BLUE TOP TUBE

## 2023-10-23 LAB — BETA-HYDROXYBUTYRATE, PLASMA OR SERUM: BETA-HYDROXYBUTYRATE: 0.5 mmol/L (ref <6.00)

## 2023-10-23 LAB — GRAY TOP TUBE

## 2023-10-23 MED ORDER — INSULIN LISPRO 100 UNIT/ML SUB-Q - CHARGE BY DOSE
12.0000 [IU] | SUBCUTANEOUS | Status: DC
Start: 2023-10-23 — End: 2023-10-23

## 2023-10-23 NOTE — ED Provider Notes (Signed)
 Department of Emergency Medicine  [facility]  10/23/2023            Patient is a 73 y.o. male presenting to the ED with chief complaint of Hyperglycemia (Family states pt not acting himself took blood sugar today monitor reading "hi", also states acting lethargic over the past couple of days.)    The patient is a 73 year old male presents with high blood sugar and being fatigued.  The high glucose on the meter was noted this afternoon.    Past Medical History:   Diagnosis Date    COPD (chronic obstructive pulmonary disease) (CMS [facility])     CVA (cerebrovascular accident) (CMS [facility])     Diabetes mellitus, type 2 (CMS [facility])     GERD (gastroesophageal reflux disease)      Past Surgical History:   Procedure Laterality Date    Coronary artery angioplasty      Hx back surgery      Hx hip replacement Right      Allergies   Allergen Reactions    Sulfa (Sulfonamides) Hives/ Urticaria      Current Outpatient Medications   Medication Sig    aspirin 81 mg Oral Tablet, Chewable Chew 1 Tablet (81 mg total)    atenoloL (TENORMIN) 50 mg Oral Tablet 1 Tablet (50 mg total) Once a day    atorvastatin (LIPITOR) 40 mg Oral Tablet Take 1 Tablet (40 mg total) by mouth Every evening for 30 days    buPROPion (WELLBUTRIN XL) 150 mg extended release 24 hr tablet Take 1 Tablet (150 mg total) by mouth Every morning    donepeziL (ARICEPT) 10 mg Oral Tablet Take 1 Tablet (10 mg total) by mouth Every night    dutasteride (AVODART) 0.5 mg Oral Capsule Take 1 Capsule (0.5 mg total) by mouth Once a day    famotidine (PEPCID) 40 mg Oral Tablet Take 1 Tablet (40 mg total) by mouth Twice daily    folic acid (FOLVITE) 1 mg Oral Tablet Take 1 Tablet (1 mg total) by mouth Once a day    JARDIANCE 10 mg Oral Tablet Take 1 Tablet (10 mg total) by mouth Once a day    magnesium oxide (MAGOX) 400 mg Oral Tablet 1 Tablet (400 mg total) Once a day    tamsulosin (FLOMAX) 0.4 mg Oral Capsule Take 2 Capsules (0.8 mg total) by mouth Once a day    tiotropium  bromide (SPIRIVA RESPIMAT) 2.5 mcg/actuation Inhalation oral inhaler Take 2 Inhalations (2 Puffs total) by inhalation Once a day    traZODone (DESYREL) 100 mg Oral Tablet Take 1 Tablet (100 mg total) by mouth Every night      Above history reviewed with patient.  Previous records also reviewed.     Review of Systems   Eyes:  Positive for visual disturbance.   Endocrine: Positive for polydipsia and polyuria.        High blood sugar   All other systems reviewed and are negative.      All other systems reviewed and are negative, unless commented on in the HPI.     Filed Vitals:    10/23/23 1644   BP: 113/64   Pulse: 61   Resp: 18   Temp: 36.2 C (97.1 F)   SpO2: 96%       Physical Exam  Vitals and nursing note reviewed.   Constitutional:       Appearance: Normal appearance.   HENT:      Mouth/Throat:  Mouth: Mucous membranes are dry.      Pharynx: Oropharynx is clear.   Cardiovascular:      Rate and Rhythm: Normal rate and regular rhythm.      Heart sounds: Normal heart sounds.   Pulmonary:      Effort: Pulmonary effort is normal.      Breath sounds: Normal breath sounds.   Musculoskeletal:      Right lower leg: No edema.      Left lower leg: No edema.   Skin:     General: Skin is warm and dry.   Neurological:      Mental Status: He is alert and oriented to person, place, and time.          Procedures       Workup:     Results for orders placed or performed during the hospital encounter of 10/23/23 (from the past 24 hours)   CBC/DIFF    Narrative    The following orders were created for panel order CBC/DIFF.  Procedure                               Abnormality         Status                     ---------                               -----------         ------                     CBC WITH UJWJ[[id number]]                Abnormal            Final result                 Please view results for these tests on the individual orders.   BASIC METABOLIC PANEL   Result Value Ref Range    SODIUM 140 136 - 145 mmol/L     POTASSIUM 4.4 3.5 - 5.1 mmol/L    CHLORIDE 107 96 - 111 mmol/L    CO2 TOTAL 23 23 - 31 mmol/L    ANION GAP 10 4 - 13 mmol/L    CALCIUM 9.7 8.6 - 10.3 mg/dL    GLUCOSE 621 (H) 65 - 125 mg/dL    BUN 13 8 - 25 mg/dL    CREATININE 3.08 6.57 - 1.35 mg/dL    BUN/CREA RATIO 12 6 - 22    ESTIMATED GFR - MALE 73 >=60 mL/min/BSA   HGA1C (HEMOGLOBIN A1C WITH EST AVG GLUCOSE)   Result Value Ref Range    HEMOGLOBIN A1C 6.9 (H) <=5.7 %    ESTIMATED AVERAGE GLUCOSE 151 mg/dL    Narrative    ADA endorsed glycated hemoglobin decision points:  5.7-6.4% Pre-Diabetes   >6.5% Diabetes      BETA-HYDROXYBUTYRATE, PLASMA OR SERUM   Result Value Ref Range    BETA-HYDROXYBUTYRATE 0.50 <6.00 mmol/L   CBC WITH DIFF   Result Value Ref Range    WBC 5.7 3.7 - 11.0 x10^3/uL    RBC 5.53 4.50 - 6.10 x10^6/uL    HGB 16.2 13.4 - 17.5 g/dL    HCT 84.6 96.2 - 95.2 %    MCV 89.0 78.0 -  100.0 fL    MCH 29.3 26.0 - 32.0 pg    MCHC 32.9 31.0 - 35.5 g/dL    RDW-CV 47.8 (H) 29.5 - 15.5 %    PLATELETS 155 150 - 400 x10^3/uL    MPV 10.3 8.7 - 12.5 fL    NEUTROPHIL % 65.6 %    LYMPHOCYTE % 24.1 %    MONOCYTE % 8.4 %    EOSINOPHIL % 1.0 %    BASOPHIL % 0.7 %    NEUTROPHIL # 3.76 1.50 - 7.70 x10^3/uL    LYMPHOCYTE # 1.38 1.00 - 4.80 x10^3/uL    MONOCYTE # 0.48 0.20 - 1.10 x10^3/uL    EOSINOPHIL # <0.10 <=0.50 x10^3/uL    BASOPHIL # <0.10 <=0.20 x10^3/uL    IMMATURE GRANULOCYTE % 0.2 0.0 - 1.0 %    IMMATURE GRANULOCYTE # <0.10 <0.10 x10^3/uL   EXTRA TUBES    Narrative    The following orders were created for panel order EXTRA TUBES.  Procedure                               Abnormality         Status                     ---------                               -----------         ------                     Trixie Rude AOZH[[id number]]                                    In process                 GOLD TOP GEXB[[id number]]                                    In process                 GRAY TOP NUUV[[id number]]                                    In process                    Please view results for these tests on the individual orders.   POC BLOOD GLUCOSE (RESULTS)   Result Value Ref Range    GLUCOSE, POC 119 (H) 60 - 110 mg/dl       No orders to display       Orders Placed This Encounter    CBC/DIFF    BASIC METABOLIC PANEL    HGA1C (HEMOGLOBIN A1C WITH EST AVG GLUCOSE)    BETA-HYDROXYBUTYRATE, PLASMA OR SERUM    CBC WITH DIFF    EXTRA TUBES    BLUE TOP TUBE    GOLD TOP TUBE    GRAY TOP TUBE       ECG:     Abnormal Lab results:  Labs Ordered/Reviewed   BASIC METABOLIC PANEL - Abnormal; Notable for the  following components:       Result Value    GLUCOSE 128 (*)     All other components within normal limits   HGA1C (HEMOGLOBIN A1C WITH EST AVG GLUCOSE) - Abnormal; Notable for the following components:    HEMOGLOBIN A1C 6.9 (*)     All other components within normal limits    Narrative:     ADA endorsed glycated hemoglobin decision points:                  5.7-6.4% Pre-Diabetes                   >6.5% Diabetes                      CBC WITH DIFF - Abnormal; Notable for the following components:    RDW-CV 16.0 (*)     All other components within normal limits   POC BLOOD GLUCOSE (RESULTS) - Abnormal; Notable for the following components:    GLUCOSE, POC 119 (*)     All other components within normal limits   BETA-HYDROXYBUTYRATE, PLASMA OR SERUM - Normal   CBC/DIFF    Narrative:     The following orders were created for panel order CBC/DIFF.                  Procedure                               Abnormality         Status                                     ---------                               -----------         ------                                     CBC WITH ZOXW[[id number]]                Abnormal            Final result                                                 Please view results for these tests on the individual orders.   EXTRA TUBES    Narrative:     The following orders were created for panel order EXTRA TUBES.                  Procedure                                Abnormality         Status                                     ---------                               -----------         ------  BLUE TOP WNUU[[id number]]                                    In process                                 GOLD TOP HKVQ[[id number]]                                    In process                                 GRAY TOP IEPP[[id number]]                                    In process                                                   Please view results for these tests on the individual orders.   BLUE TOP TUBE   GOLD TOP TUBE   GRAY TOP TUBE       ED Course:  Appropriate labs and imaging ordered. Medical Records reviewed.   During the patient's stay in the emergency department, the above listed imaging and/or labs were performed to assist with medical decision making and were reviewed by myself when available for review.   Medical Decision Making  The patient is a 73 year old male presents with elevated blood sugar.  He is accompanied by a family member who reports that he has been eating plenty of carbohydrates lately.  The patient is on Jardiance.  He does not take glucose regularly.  On the meter today it read "high".  He has had some polyuria polydipsia but denies extremity symptoms.  He has had some vision complaints with his glucose has been high.  On exam today lungs are clear to auscultation heart is regular rate and rhythm.  There is no pedal edema noted.  We will do routine blood work and give insulin.  The glucose level however was normal after a fingerstick glucose check in the ER.  We rechecked it with a BMI PE and found that it was still within normal limits for postprandial glucose.  I went to discuss the results with the patient and he had eloped from the ER.    Amount and/or Complexity of Data Reviewed  Labs: ordered.     Details: See labs, no significant abnormalities.    Risk  Prescription drug management.           Pt remained stable  throughout the emergency department course.      Impression:    Clinical Impression   Hyperglycemia (Primary)      Disposition:  Eloped     Follow Up Plan:  No follow-up provider specified.

## 2023-10-23 NOTE — ED Nurses Note (Signed)
 Called for patient to bring patient back into triage. Patient not found in ED waiting area, outpatient waiting area, or outside.
